# Patient Record
Sex: Male | Born: 1960 | Race: White | Hispanic: No | Marital: Married | Smoking: Never smoker
Health system: Southern US, Community
[De-identification: ages and names within clinical notes are randomized; demographics above are authoritative.]

## PROBLEM LIST (undated history)

## (undated) DIAGNOSIS — I10 Essential (primary) hypertension: Secondary | ICD-10-CM

## (undated) HISTORY — PX: HERNIA REPAIR: SHX51

## (undated) HISTORY — PX: CHOLECYSTECTOMY: SHX55

## (undated) HISTORY — PX: TONSILLECTOMY: SUR1361

---

## 2010-02-03 ENCOUNTER — Emergency Department (HOSPITAL_BASED_OUTPATIENT_CLINIC_OR_DEPARTMENT_OTHER): Admission: EM | Admit: 2010-02-03 | Discharge: 2010-02-03 | Payer: Self-pay | Admitting: Emergency Medicine

## 2010-05-26 LAB — URINALYSIS, ROUTINE W REFLEX MICROSCOPIC
Bilirubin Urine: NEGATIVE
Glucose, UA: NEGATIVE mg/dL
Ketones, ur: 15 mg/dL — AB
Protein, ur: 100 mg/dL — AB
pH: 5.5 (ref 5.0–8.0)

## 2010-05-26 LAB — URINE MICROSCOPIC-ADD ON

## 2014-11-04 ENCOUNTER — Encounter (HOSPITAL_COMMUNITY): Payer: Self-pay | Admitting: *Deleted

## 2014-11-04 ENCOUNTER — Ambulatory Visit (HOSPITAL_COMMUNITY): Payer: 59

## 2014-11-04 ENCOUNTER — Emergency Department (HOSPITAL_COMMUNITY)
Admission: EM | Admit: 2014-11-04 | Discharge: 2014-11-04 | Disposition: A | Discharge status: Home / Self Care | Payer: 59 | Attending: Emergency Medicine | Admitting: Emergency Medicine

## 2014-11-04 DIAGNOSIS — K432 Incisional hernia without obstruction or gangrene: Secondary | ICD-10-CM

## 2014-11-04 DIAGNOSIS — K56600 Partial intestinal obstruction, unspecified as to cause: Secondary | ICD-10-CM

## 2014-11-04 DIAGNOSIS — Z880 Allergy status to penicillin: Secondary | ICD-10-CM | POA: Insufficient documentation

## 2014-11-04 DIAGNOSIS — I10 Essential (primary) hypertension: Secondary | ICD-10-CM | POA: Insufficient documentation

## 2014-11-04 DIAGNOSIS — R1013 Epigastric pain: Secondary | ICD-10-CM | POA: Diagnosis present

## 2014-11-04 HISTORY — DX: Essential (primary) hypertension: I10

## 2014-11-04 LAB — CBC WITH DIFFERENTIAL/PLATELET
BASOS ABS: 0 10*3/uL (ref 0.0–0.1)
BASOS PCT: 0 % (ref 0–1)
EOS ABS: 0.1 10*3/uL (ref 0.0–0.7)
EOS PCT: 1 % (ref 0–5)
HCT: 47.4 % (ref 39.0–52.0)
Hemoglobin: 16.3 g/dL (ref 13.0–17.0)
Lymphocytes Relative: 9 % — ABNORMAL LOW (ref 12–46)
Lymphs Abs: 1.2 10*3/uL (ref 0.7–4.0)
MCH: 31.2 pg (ref 26.0–34.0)
MCHC: 34.4 g/dL (ref 30.0–36.0)
MCV: 90.8 fL (ref 78.0–100.0)
Monocytes Absolute: 1 10*3/uL (ref 0.1–1.0)
Monocytes Relative: 8 % (ref 3–12)
Neutro Abs: 10.7 10*3/uL — ABNORMAL HIGH (ref 1.7–7.7)
Neutrophils Relative %: 82 % — ABNORMAL HIGH (ref 43–77)
PLATELETS: 218 10*3/uL (ref 150–400)
RBC: 5.22 MIL/uL (ref 4.22–5.81)
RDW: 12.6 % (ref 11.5–15.5)
WBC: 13.1 10*3/uL — AB (ref 4.0–10.5)

## 2014-11-04 LAB — COMPREHENSIVE METABOLIC PANEL
ALT: 24 U/L (ref 17–63)
AST: 19 U/L (ref 15–41)
Albumin: 4.3 g/dL (ref 3.5–5.0)
Alkaline Phosphatase: 62 U/L (ref 38–126)
Anion gap: 7 (ref 5–15)
BUN: 11 mg/dL (ref 6–20)
CHLORIDE: 102 mmol/L (ref 101–111)
CO2: 26 mmol/L (ref 22–32)
CREATININE: 0.86 mg/dL (ref 0.61–1.24)
Calcium: 9.4 mg/dL (ref 8.9–10.3)
GFR calc non Af Amer: >60 mL/min (ref >60)
Glucose, Bld: 139 mg/dL — ABNORMAL HIGH (ref 65–99)
Potassium: 3.8 mmol/L (ref 3.5–5.1)
SODIUM: 135 mmol/L (ref 135–145)
Total Bilirubin: 2.7 mg/dL — ABNORMAL HIGH (ref 0.3–1.2)
Total Protein: 7.8 g/dL (ref 6.5–8.1)

## 2014-11-04 LAB — I-STAT CG4 LACTIC ACID, ED: LACTIC ACID, VENOUS: 1.11 mmol/L (ref 0.5–2.0)

## 2014-11-04 LAB — LIPASE, BLOOD: Lipase: 15 U/L — ABNORMAL LOW (ref 22–51)

## 2014-11-04 MED ORDER — LIDOCAINE VISCOUS 2 % MT SOLN
15.0000 mL | Freq: Once | OROMUCOSAL | Status: AC
  Administered 2014-11-04: 15 mL via OROMUCOSAL
  Filled 2014-11-04: qty 15

## 2014-11-04 MED ORDER — MORPHINE SULFATE (PF) 4 MG/ML IV SOLN
8.0000 mg | Freq: Once | INTRAVENOUS | Status: AC
  Administered 2014-11-04: 8 mg via INTRAVENOUS
  Filled 2014-11-04: qty 2

## 2014-11-04 MED ORDER — IOHEXOL 300 MG/ML  SOLN
150.0000 mL | Freq: Once | INTRAMUSCULAR | Status: AC | PRN
  Administered 2014-11-04: 125 mL via INTRAVENOUS

## 2014-11-04 MED ORDER — IOHEXOL 300 MG/ML  SOLN
50.0000 mL | Freq: Once | INTRAMUSCULAR | Status: DC | PRN
  Administered 2014-11-04: 50 mL via ORAL
  Filled 2014-11-04: qty 50

## 2014-11-04 MED ORDER — ALUM & MAG HYDROXIDE-SIMETH 200-200-20 MG/5ML PO SUSP
15.0000 mL | Freq: Once | ORAL | Status: AC
  Administered 2014-11-04: 15 mL via ORAL
  Filled 2014-11-04: qty 30

## 2014-11-04 MED ORDER — ONDANSETRON HCL 4 MG/2ML IJ SOLN
4.0000 mg | Freq: Once | INTRAMUSCULAR | Status: AC
  Administered 2014-11-04: 4 mg via INTRAVENOUS
  Filled 2014-11-04: qty 2

## 2014-11-04 NOTE — ED Notes (Addendum)
Pt reports abd pain since yesterday, pain 9/10, vomited this morning.pt went to pcp, who sent pt for xray, xray shows "surgical clips right upper quadrant. dilated loops of small bowel are noted. Stool is in the right colon appear finding noted suggestive of adynamic ileus, partial small bowel obstruction cannot be excluded. No free air".   Last bowel movement yesterday. Hx of abd hernia repair in 2003  Dr Luiz Iron pcp

## 2014-11-04 NOTE — ED Provider Notes (Signed)
CSN: [id number]     Arrival date & time 11/04/14  1129 History   First MD Initiated Contact with Patient 11/04/14 1211     Chief Complaint  Patient presents with  . Abdominal Pain  . Possible Bowel Obstruction      (Consider location/radiation/quality/duration/timing/severity/associated sxs/prior Treatment) Patient is a 54 y.o. male presenting with abdominal pain. The history is provided by the patient.  Abdominal Pain Pain location:  Epigastric Pain quality: sharp and shooting   Pain radiates to:  Does not radiate Pain severity:  Severe Onset quality:  Gradual Duration:  40 weeks Timing:  Intermittent Progression:  Waxing and waning Chronicity:  New Relieved by:  Nothing Worsened by:  Nothing tried Ineffective treatments:  None tried Associated symptoms: nausea and vomiting (x1)   Associated symptoms: no chest pain, no chills, no diarrhea, no fever and no shortness of breath   Risk factors: no alcohol abuse    54 yo M with a chief complaint of epigastric abdominal pain. This started about 8 or 9 months ago and has slowly come and gone. This episode started yesterday. Sharp shooting epigastric. Denies radiation anywhere. This visit with one episode of vomiting this morning. Went to outpatient visit had a KUB that was concerning for acute bowel obstruction was sent here for evaluation. Patient has had a hernia repair as well as a cholecystectomy. Denies fevers or chills. Has had flatus without difficulty. Last bowel movement was yesterday and was normal for him.  Past Medical History  Diagnosis Date  . Hypertension    Past Surgical History  Procedure Laterality Date  . Hernia repair      abdominal 2003   History reviewed. No pertinent family history. Social History  Substance Use Topics  . Smoking status: Never Smoker   . Smokeless tobacco: None  . Alcohol Use: Yes    Review of Systems  Constitutional: Negative for fever and chills.  HENT: Negative for congestion and  facial swelling.   Eyes: Negative for discharge and visual disturbance.  Respiratory: Negative for shortness of breath.   Cardiovascular: Negative for chest pain and palpitations.  Gastrointestinal: Positive for nausea, vomiting (x1) and abdominal pain. Negative for diarrhea.  Musculoskeletal: Negative for myalgias and arthralgias.  Skin: Negative for color change and rash.  Neurological: Negative for tremors, syncope and headaches.  Psychiatric/Behavioral: Negative for confusion and dysphoric mood.      Allergies  Penicillins  Home Medications   Prior to Admission medications   Medication Sig Start Date End Date Taking? Authorizing Provider  hydrochlorothiazide (HYDRODIURIL) 25 MG tablet Take 25 mg by mouth daily. 10/29/14  Yes Historical Provider, MD  nadolol (CORGARD) 40 MG tablet Take 40 mg by mouth daily. 10/29/14  Yes Historical Provider, MD  telmisartan (MICARDIS) 40 MG tablet Take 40 mg by mouth daily. 10/29/14  Yes Historical Provider, MD   BP 148/77 mmHg  Pulse 61  Temp(Src) 97.7 F (36.5 C) (Oral)  Resp 22  SpO2 100% Physical Exam  Constitutional: He is oriented to person, place, and time. He appears well-developed and well-nourished.  HENT:  Head: Normocephalic and atraumatic.  Eyes: EOM are normal. Pupils are equal, round, and reactive to light.  Neck: Normal range of motion. Neck supple. No JVD present.  Cardiovascular: Normal rate and regular rhythm.  Exam reveals no gallop and no friction rub.   No murmur heard. Pulmonary/Chest: No respiratory distress. He has no wheezes.  Abdominal: He exhibits mass (periumbilical hernia). He exhibits no distension. There is  tenderness (Epigastric). There is no rebound and no guarding.  Musculoskeletal: Normal range of motion.  Neurological: He is alert and oriented to person, place, and time.  Skin: No rash noted. No pallor.  Psychiatric: He has a normal mood and affect. His behavior is normal.    ED Course  Procedures  (including critical care time) Labs Review Labs Reviewed  CBC WITH DIFFERENTIAL/PLATELET - Abnormal; Notable for the following:    WBC 13.1 (*)    Neutrophils Relative % 82 (*)    Neutro Abs 10.7 (*)    Lymphocytes Relative 9 (*)    All other components within normal limits  COMPREHENSIVE METABOLIC PANEL - Abnormal; Notable for the following:    Glucose, Bld 139 (*)    Total Bilirubin 2.7 (*)    All other components within normal limits  LIPASE, BLOOD - Abnormal; Notable for the following:    Lipase 15 (*)    All other components within normal limits  I-STAT CG4 LACTIC ACID, ED  I-STAT CG4 LACTIC ACID, ED    Imaging Review Ct Abdomen Pelvis W Contrast  11/04/2014   CLINICAL DATA:  Pain, vomiting.  EXAM: CT ABDOMEN AND PELVIS WITH CONTRAST  TECHNIQUE: Multidetector CT imaging of the abdomen and pelvis was performed using the standard protocol following bolus administration of intravenous contrast.  CONTRAST:  OMNIPAQUE IOHEXOL 300 MG/ML  SOLN  COMPARISON:  None.  FINDINGS: Lower chest:  Clear lung bases.  Normal heart size.  Hepatobiliary: Diffuse hepatic low attenuation as can be seen with hepatic steatosis. No focal hepatic mass. Prior cholecystectomy.  Pancreas: Normal.  Spleen: Normal.  Adrenals/Urinary Tract: Normal adrenal glands. Normal kidneys. No urolithiasis or obstructive uropathy. Normal bladder.  Stomach/Bowel: There is mild small bowel dilatation with multiple small bowel air-fluid levels. There is a left paramedian supraumbilical hernia containing a loop of small bowel with the distal small bowel decompressed. There is no abdominal or pelvic free fluid. There is no pneumatosis, pneumoperitoneum or portal venous gas.  Vascular/Lymphatic: Normal caliber abdominal aorta with atherosclerosis. No abdominal or pelvic lymphadenopathy.  Other: No fluid collection or hematoma.  Musculoskeletal: No lytic or sclerotic osseous lesion. No acute osseous abnormality.  IMPRESSION: 1. Mild  small bowel dilatation with multiple air-fluid levels most consistent with a small bowel obstruction secondary to a left paramedian supraumbilical hernia. 2. Hepatic steatosis.   Electronically Signed   By: Elige Ko   On: 11/04/2014 14:40   I have personally reviewed and evaluated these images and lab results as part of my medical decision-making.   EKG Interpretation None      MDM   Final diagnoses:  Incisional hernia, without obstruction or gangrene  Partial small bowel obstruction    54 yo M with a chief complaint of epigastric abdominal pain. With psychosis noted at outside facility. No other labs again return. Will obtain a i-STAT lactate CBC CMP and lipase. CT scan of abdomen and pelvis contrast to evaluate for possibility of small bowel obstruction.   Mild leukocytosis total bili elevated at 2.7.   Partial small bowel structure noted on CT scan associated with periumbilical hernia. Patient reassessed and feeling much better. He'll do palpate a defect hernia no longer palpable. With patient feeling much better seen no need for surgical consult. The patient follow-up with his surgeon for repair.  3:24 PM:  I have discussed the diagnosis/risks/treatment options with the patient and family and believe the pt to be eligible for discharge home to follow-up with  General sugery. We also discussed returning to the ED immediately if new or worsening sx occur. We discussed the sx which are most concerning (e.g., sudden worsening pain, no flatus) that necessitate immediate return. Medications administered to the patient during their visit and any new prescriptions provided to the patient are listed below.  Medications given during this visit Medications  iohexol (OMNIPAQUE) 300 MG/ML solution 50 mL (50 mLs Oral Contrast Given 11/04/14 1252)  morphine 4 MG/ML injection 8 mg (8 mg Intravenous Given 11/04/14 1334)  ondansetron (ZOFRAN) injection 4 mg (4 mg Intravenous Given 11/04/14 1334)  alum  & mag hydroxide-simeth (MAALOX/MYLANTA) 200-200-20 MG/5ML suspension 15 mL (15 mLs Oral Given 11/04/14 1334)  lidocaine (XYLOCAINE) 2 % viscous mouth solution 15 mL (15 mLs Mouth/Throat Given 11/04/14 1334)  iohexol (OMNIPAQUE) 300 MG/ML solution 150 mL (125 mLs Intravenous Contrast Given 11/04/14 1417)    New Prescriptions   No medications on file     The patient appears reasonably screen and/or stabilized for discharge and I doubt any other medical condition or other [facility] requiring further screening, evaluation, or treatment in the ED at this time prior to discharge.    Melene Plan, DO 11/04/14 1524

## 2014-11-04 NOTE — Discharge Instructions (Signed)
Take 4 over the counter ibuprofen tablets 3 times a day or 2 over-the-counter naproxen tablets twice a day for pain.  Hernia A hernia occurs when an internal organ pushes out through a weak spot in the abdominal wall. Hernias most commonly occur in the groin and around the navel. Hernias often can be pushed back into place (reduced). Most hernias tend to get worse over time. Some abdominal hernias can get stuck in the opening (irreducible or incarcerated hernia) and cannot be reduced. An irreducible abdominal hernia which is tightly squeezed into the opening is at risk for impaired blood supply (strangulated hernia). A strangulated hernia is a medical emergency. Because of the risk for an irreducible or strangulated hernia, surgery may be recommended to repair a hernia. CAUSES   Heavy lifting.  Prolonged coughing.  Straining to have a bowel movement.  A cut (incision) made during an abdominal surgery. HOME CARE INSTRUCTIONS   Bed rest is not required. You may continue your normal activities.  Avoid lifting more than 10 pounds (4.5 kg) or straining.  Cough gently. If you are a smoker it is best to stop. Even the best hernia repair can break down with the continual strain of coughing. Even if you do not have your hernia repaired, a cough will continue to aggravate the problem.  Do not wear anything tight over your hernia. Do not try to keep it in with an outside bandage or truss. These can damage abdominal contents if they are trapped within the hernia sac.  Eat a normal diet.  Avoid constipation. Straining over long periods of time will increase hernia size and encourage breakdown of repairs. If you cannot do this with diet alone, stool softeners may be used. SEEK IMMEDIATE MEDICAL CARE IF:   You have a fever.  You develop increasing abdominal pain.  You feel nauseous or vomit.  Your hernia is stuck outside the abdomen, looks discolored, feels hard, or is tender.  You have any  changes in your bowel habits or in the hernia that are unusual for you.  You have increased pain or swelling around the hernia.  You cannot push the hernia back in place by applying gentle pressure while lying down. MAKE SURE YOU:   Understand these instructions.  Will watch your condition.  Will get help right away if you are not doing well or get worse. Document Released: 03/01/2005 Document Revised: 05/24/2011 Document Reviewed: 10/19/2007 [facility] Patient Information 2015 [place], [place]. This information is not intended to replace advice given to you by your health care provider. Make sure you discuss any questions you have with your health care provider.

## 2015-03-16 HISTORY — PX: HERNIA REPAIR: SHX51

## 2018-04-11 ENCOUNTER — Other Ambulatory Visit: Payer: Self-pay | Admitting: General Surgery

## 2018-04-11 ENCOUNTER — Other Ambulatory Visit (HOSPITAL_COMMUNITY): Payer: Self-pay | Admitting: General Surgery

## 2018-04-18 ENCOUNTER — Ambulatory Visit (HOSPITAL_COMMUNITY)
Admission: RE | Admit: 2018-04-18 | Discharge: 2018-04-18 | Disposition: A | Discharge status: Home / Self Care | Payer: 59 | Source: Ambulatory Visit | Attending: General Surgery | Admitting: General Surgery

## 2018-04-18 ENCOUNTER — Other Ambulatory Visit: Payer: Self-pay

## 2018-04-18 ENCOUNTER — Other Ambulatory Visit (HOSPITAL_COMMUNITY): Payer: Self-pay | Admitting: General Surgery

## 2018-04-26 ENCOUNTER — Encounter: Payer: 59 | Attending: General Surgery | Admitting: Skilled Nursing Facility1

## 2018-04-26 ENCOUNTER — Encounter: Payer: Self-pay | Admitting: Skilled Nursing Facility1

## 2018-04-26 DIAGNOSIS — E669 Obesity, unspecified: Secondary | ICD-10-CM | POA: Diagnosis present

## 2018-04-26 NOTE — Progress Notes (Signed)
Pre-Op Assessment Visit:  Pre-Operative Sleeve Gastrectomy Surgery  Medical Nutrition Therapy:  Appt start time: 1:58 End time:  3:00  Patient was seen on 04/26/2018 for Pre-Operative Nutrition Assessment. Assessment and letter of approval faxed to [facility] Bariatric Surgery Program coordinator on 04/26/2018.   According to pts referral pt needs to be below 52 BMI before continuing on to have surgery so the pt needs to lose about 41 pounds.  To discuss next time: healthy alternatives for flavoring foods  Pt expectation of surgery: to lose weight  Pt expectation of Dietitian: none  Start weight at [facility]: 406.5 BMI: 57.50   24 hr Dietary Recall: First Meal: skipped Snack: candy Second Meal: fast food Snack: candy Third Meal: eating out or chicken and dumplings  Snack: dessert Beverages: water  Encouraged to engage in 150 minutes of moderate physical activity including cardiovascular and weight baring weekly  Handouts given during visit include:  . Pre-Op Goals . Bariatric Surgery Protein Shakes During the appointment today the following Pre-Op Goals were reviewed with the patient: . Track your food . Make healthy food choices: limit yourself to eating out no more than 4 times a week . Begin to limit portion sizes . Limited concentrated sugars and fried foods . Keep fat/sugar in the single digits per serving on             food labels . Practice CHEWING your food  (aim for 30 chews per bite or until applesauce consistency) . Practice not drinking 15 minutes before, during, and 30 minutes after each meal/snack . Avoid all carbonated beverages  . Avoid/limit caffeinated beverages  . Avoid all sugar-sweetened beverages . Consume 3 meals per day; eat every 3-5 hours . Make a list of non-food related activities . Aim for 64-100 ounces of FLUID daily  . Aim for at least 60-80 grams of PROTEIN daily . Look for a liquid protein source that contain ?15 g protein and  ?5 g carbohydrate  (ex: shakes, drinks, shots): get them for breakfast   -Follow diet recommendations listed below   Energy and Macronutrient Recomendations: Calories: 1600 Carbohydrate: 180 Protein: 120 Fat: 44  Demonstrated degree of understanding via:  Teach Back  Teaching Method Utilized:  Visual Auditory Hands on  Barriers to learning/adherence to lifestyle change: none identified   Patient to call the [facility] to enroll in Pre-Op and Post-Op Nutrition Education when surgery date is scheduled.

## 2018-05-10 ENCOUNTER — Encounter: Payer: 59 | Admitting: Skilled Nursing Facility1

## 2018-05-10 DIAGNOSIS — E669 Obesity, unspecified: Secondary | ICD-10-CM

## 2018-05-10 NOTE — Progress Notes (Signed)
Sleeve  Assessment: 1st [facility] Appointment.   According to pts referral pt needs to be below 52 BMI before continuing on to have surgery so the pt needs to lose about 41 pounds.   Pt arrives having lost about 15.6 pounds.  Pt states he struggles with eating breakfast and states he hated vanilla premier protein shake. Pt states he has not eaten anything fried until last night. Pt states he has not had any fast food since last visit. Pt states he went to movies and did not get popcorn or soda. Pt states just from moving more at the shop he physically feels better.    Start weight at [facility]: 406.5 Wt: 390.9 BMI: 55.30  MEDICATIONS: see list   DIETARY INTAKE:  24-hr recall:  B ( AM): skipped Snk ( AM):  L ( PM): salad: ham cheese eggs lettuce tomato ranch Snk ( PM): D ( PM): chicken pepper onion hush puppies and cole slaw or ribs with potato and cole slaw  Snk ( PM):  Beverages: water, 2 glasses of orange juice since last month   Usual physical activity: walking dog 20 minutes   Diet to Follow: 1800 calories 200 g carbohydrates 135 g protein 50 g fat   Nutritional Diagnosis:  [place]-3.3 Overweight/obesity related to past poor dietary habits and physical inactivity as evidenced by patient w/ planned sleeve gastrectomy surgery following dietary guidelines for continued weight loss.    Intervention:  Nutrition counseling for upcoming Bariatric Surgery. Goals: -Encouraged to engage in 150 minutes of moderate physical activity including cardiovascular and weight baring weekly -For your ACP tonight: beans instead of rice, ask for cheese on the side and only use half giving the rest to someone at the table -Try some more protein shakes -Get up once an hour and walk the shop  Teaching Method Utilized:  Visual Auditory Hands on  Barriers to learning/adherence to lifestyle change: none identified   Demonstrated degree of understanding via:  Teach Back   Monitoring/Evaluation:  Dietary  intake, exercise, and body weight 3 weeks

## 2018-06-08 ENCOUNTER — Ambulatory Visit: Payer: 59 | Admitting: Skilled Nursing Facility1

## 2018-06-21 ENCOUNTER — Other Ambulatory Visit: Payer: Self-pay

## 2018-06-21 ENCOUNTER — Encounter: Payer: 59 | Attending: General Surgery | Admitting: Skilled Nursing Facility1

## 2018-06-21 DIAGNOSIS — E669 Obesity, unspecified: Secondary | ICD-10-CM | POA: Diagnosis present

## 2018-06-21 NOTE — Progress Notes (Signed)
Sleeve  Assessment: 2nd SWL Appointment.   According to pts referral pt needs to be below 52 BMI before continuing on to have surgery so the pt needs to lose about 41 pounds.  Pt arrives having lost about 13.2 pounds. Pt states he avoids weighing now that he sees how stressful it is to weigh at home. Pt states he has been more active with walking around the shop. Pt states he was trying to drink protein shakes for breakfast but he finds them to have a bad aftertaste so he has not been eating breakfast. Pt states he has cut out eating fried foods stating it has not been too bad.   Start weight at [facility]: 406.5 Wt: 377.7 BMI: 53.43  MEDICATIONS: see list   DIETARY INTAKE:  24-hr recall:  B ( AM): skipped Snk ( AM):  L ( PM): salad: ham cheese eggs lettuce tomato ranch or [place] sandwich Snk ( PM): D ( PM): chicken pepper onion hush puppies and cole slaw or ribs with potato and cole slaw steak or chicken with salad and baked potato or chicken stir fry  Snk ( PM):  Beverages: water, gatorade   Usual physical activity: walking dog 20 minutes   Diet to Follow: 1800 calories 200 g carbohydrates 135 g protein 50 g fat   Nutritional Diagnosis:  [place]-3.3 Overweight/obesity related to past poor dietary habits and physical inactivity as evidenced by patient w/ planned sleeve gastrectomy surgery following dietary guidelines for continued weight loss.    Intervention:  Nutrition counseling for upcoming Bariatric Surgery. Goals: -Encouraged to engage in 150 minutes of moderate physical activity including cardiovascular and weight baring weekly -For your ACP tonight: beans instead of rice, ask for cheese on the side and only use half giving the rest to someone at the table -Try some more protein shakes -Continue to walk around the shop -Continue to not eat fried foods  -Grape a piece of fruit in the morning before going to work or a Optometrist Method Utilized:   Scientific laboratory technician Hands on  Barriers to learning/adherence to lifestyle change: none identified   Demonstrated degree of understanding via:  Teach Back   Monitoring/Evaluation:  Dietary intake, exercise, and body weight 3 weeks

## 2018-07-20 ENCOUNTER — Ambulatory Visit: Payer: 59 | Admitting: Skilled Nursing Facility1

## 2018-07-26 ENCOUNTER — Encounter: Payer: 59 | Attending: General Surgery | Admitting: Skilled Nursing Facility1

## 2018-07-26 ENCOUNTER — Other Ambulatory Visit: Payer: Self-pay

## 2018-07-26 DIAGNOSIS — E669 Obesity, unspecified: Secondary | ICD-10-CM | POA: Diagnosis present

## 2018-07-26 NOTE — Progress Notes (Signed)
Sleeve  Assessment: 3rd SWL Appointment.   According to pts referral pt needs to be below 52 BMI before continuing on to have surgery so the pt needs to lose about 41 pounds.  Pt arrives having maintained his weight. Pt states he figured he was not going to have lost weight due to celebrating his wife's birthday. Pt states he has started eating fruit for breakfast. Pt states not eating fried food as been easier than her thought. Pt states he has more energy than when he first started coming. Pt states he has been doing more work around the house too. Pt states he loves the glass coke but has not had once for 3 months.   Start weight at [facility]: 406.5 Wt: 377.7 BMI: 53.43  MEDICATIONS: see list   DIETARY INTAKE:  24-hr recall: having more dessert throughout the week  B ( AM): fruit  Snk ( AM):  L ( PM): salad: ham cheese eggs lettuce tomato ranch or [place] sandwich Snk ( PM): D ( PM): chicken pepper onion hush puppies and cole slaw or ribs with potato and cole slaw steak or chicken with salad and baked potato or chicken stir fry  Snk ( PM):  Beverages: water, gatorade   Usual physical activity: walking dog 20 minutes 7 days a week   Diet to Follow: 1800 calories 200 g carbohydrates 135 g protein 50 g fat   Nutritional Diagnosis:  [place]-3.3 Overweight/obesity related to past poor dietary habits and physical inactivity as evidenced by patient w/ planned sleeve gastrectomy surgery following dietary guidelines for continued weight loss.    Intervention:  Nutrition counseling for upcoming Bariatric Surgery. Goals: -Encouraged to engage in 150 minutes of moderate physical activity including cardiovascular and weight baring weekly -Scale back dessert to once a week or once every other week   Teaching Method Utilized:  Visual Auditory Hands on  Barriers to learning/adherence to lifestyle change: none identified   Demonstrated degree of understanding via:  Teach Back    Monitoring/Evaluation:  Dietary intake, exercise, and body weight 3 weeks

## 2018-08-29 ENCOUNTER — Encounter: Payer: 59 | Attending: General Surgery | Admitting: Skilled Nursing Facility1

## 2018-08-29 ENCOUNTER — Other Ambulatory Visit: Payer: Self-pay

## 2018-08-29 ENCOUNTER — Ambulatory Visit: Payer: 59 | Admitting: Skilled Nursing Facility1

## 2018-08-29 DIAGNOSIS — E669 Obesity, unspecified: Secondary | ICD-10-CM | POA: Insufficient documentation

## 2018-08-29 NOTE — Progress Notes (Signed)
Sleeve  Assessment: 4th SWL Appointment.   According to pts referral pt needs to be below 52 BMI before continuing on to have surgery so the pt needs to lose about 41 pounds.  Pt states he loves the glass coke but has not had once for 3 months.   Pt arrives having lost about 7 pounds for a total of 37.5 pounds.  Pt states he can stick with being more active and not eating as much "Fun" foods keeping them to once a week or once every other week. Pt states he has severely limited his fried foods when before he would eat it 5 or mor times a week and no carbonated drinks.   Pt states he can see where he can do more physically since making all these changes.   Start weight at [facility]: 406.5 Wt: 369 BMI: 52.2  MEDICATIONS: see list   DIETARY INTAKE:  24-hr recall: having more dessert throughout the week  B ( AM): fruit  Snk ( AM):  L ( PM): salad: ham cheese eggs lettuce tomato ranch or [place] sandwich Snk ( PM): D ( PM): chicken pepper onion hush puppies and cole slaw or ribs with potato and cole slaw steak or chicken with salad and baked potato or chicken stir fry with veggies Snk ( PM):  Beverages: water, gatorade   Usual physical activity: walking dog 20 minutes 7 days a week; house/yard work  Diet to Follow: 1800 calories 200 g carbohydrates 135 g protein 50 g fat   Nutritional Diagnosis:  [place]-3.3 Overweight/obesity related to past poor dietary habits and physical inactivity as evidenced by patient w/ planned sleeve gastrectomy surgery following dietary guidelines for continued weight loss.    Intervention:  Nutrition counseling for upcoming Bariatric Surgery. Goals: -Encouraged to engage in 150 minutes of moderate physical activity including cardiovascular and weight baring weekly -Great job reaching your goals!  Teaching Method Utilized:  Visual Auditory Hands on  Barriers to learning/adherence to lifestyle change: none identified   Demonstrated degree of  understanding via:  Teach Back   Monitoring/Evaluation:  Dietary intake, exercise, and body weight

## 2018-11-06 ENCOUNTER — Encounter: Payer: Self-pay | Admitting: Dietician

## 2018-11-06 ENCOUNTER — Other Ambulatory Visit: Payer: Self-pay

## 2018-11-06 ENCOUNTER — Encounter: Payer: 59 | Attending: Surgery | Admitting: Dietician

## 2018-11-06 DIAGNOSIS — E669 Obesity, unspecified: Secondary | ICD-10-CM | POA: Diagnosis present

## 2018-11-06 NOTE — Patient Instructions (Signed)
Patient will follow-up at [facility] 2 weeks post operatively for diet advancement per MD. 

## 2018-11-06 NOTE — Progress Notes (Signed)
Pre-Operative Nutrition Class   Appt Start Time: 8:15am    End Time: 10:15am   Patient was seen on 11/06/2018 for Pre-Operative Bariatric Surgery Education at [facility].   Surgery date: (Unknown) Surgery type: Sleeve Gastrectomy  Start weight at [facility]: 406.5 lbs (date: 04/26/2018) Weight today: 365 lbs BMI: 51.6 kg/m2   Samples given per MNT protocol. Patient educated on appropriate usage: Bariatric Advantage Multivitamin Lot #[id number] Exp: 06/04/2019   Bariatric Fusion Calcium    Protein2O Protein Drink Exp: 11/15/2019   The following the learning objectives were met by the patient during this course:  Identify Pre-Op Dietary Goals and will begin 2 weeks pre-operatively  Identify appropriate sources of fluids and proteins   State protein recommendations and appropriate sources pre and post-operatively  Identify Post-Operative Dietary Goals and will follow for 2 weeks post-operatively  Identify appropriate multivitamin and calcium sources  Describe the need for physical activity post-operatively and will follow MD recommendations  State when to call healthcare provider regarding medication questions or post-operative complications   Handouts given during class include:  Pre-Op Bariatric Surgery Diet Handout  Protein Shake Handout  Post-Op Bariatric Surgery Nutrition Handout  [facility] Program Information Flyer  Support Group Information Flyer  [facility] Bariatric Supplements Price List   Follow-Up Plan: Patient will follow-up at [facility] 2 weeks post operatively for diet advancement per MD.

## 2018-12-08 NOTE — Patient Instructions (Addendum)
DUE TO COVID-19 ONLY ONE VISITOR IS ALLOWED TO COME WITH YOU AND STAY IN THE WAITING ROOM ONLY DURING PRE OP AND PROCEDURE DAY OF SURGERY. THE 1 VISITOR MAY VISIT WITH YOU AFTER SURGERY IN YOUR PRIVATE ROOM DURING VISITING HOURS ONLY!   YOU HAVE COMPLETED YOUR COVID TEST. PLEASE BEGIN THE QUARANTINE INSTRUCTIONS AS OUTLINED IN YOUR HANDOUT.                 Seth Rogers    Your procedure is scheduled on: 12-12-2018   Report to [facility] Main  Entrance    Report to admitting at 12:00PM     Call this number if you have problems the morning of surgery [place], NO CHEWING GUM [place].     Remember: NO SOLID FOOD AFTER 6:00 PM THE NIGHT BEFORE YOUR SURGERY. YOU MAY DRINK CLEAR FLUIDS. MORNING OF SURGERY DRINK:   DRINK 1 G2 drink BEFORE YOU LEAVE HOME, DRINK ALL OF THE  G2 DRINK AT ONE TIME.  THE G2 DRINK YOU DRINK BEFORE YOU LEAVE HOME WILL BE THE LAST FLUIDS YOU DRINK BEFORE SURGERY.     CLEAR LIQUID DIET   Foods Allowed                                                                     Foods Excluded  Coffee and tea, regular and decaf                             liquids that you cannot  Plain Jell-O any favor except red or purple                                           see through such as: Fruit ices (not with fruit pulp)                                     milk, soups, orange juice  Iced Popsicles                                    All solid food                                 Cranberry, grape and apple juices Sports drinks like Gatorade Lightly seasoned clear broth or consume(fat free) Sugar, honey syrup  Sample Menu Breakfast                                Lunch                                     Supper Cranberry juice  Beef broth                            Chicken broth Jell-O                                     Grape juice                           Apple  juice Coffee or tea                        Jell-O                                      Popsicle                                                Coffee or tea                        Coffee or tea  _____________________________________________________________________     Take these medicines the morning of surgery with A SIP OF WATER: NADOLOL                                 You may not have any metal on your body including hair pins and              piercings  Do not wear jewelry, make-up, lotions, powders or perfumes, deodorant                    Men may shave face and neck.   Do not bring valuables to the hospital. Seth Rogers IS NOT             RESPONSIBLE   FOR VALUABLES.  Contacts, dentures or bridgework may not be worn into surgery.  YOU MAY BRING A SMALL OVERNIGHT BAG              Please read over the following fact sheets you were given: _____________________________________________________________________           [facility] - Preparing for Surgery Before surgery, you can play an important role.  Because skin is not sterile, your skin needs to be as free of germs as possible.  You can reduce the number of germs on your skin by washing with CHG (chlorahexidine gluconate) soap before surgery.  CHG is an antiseptic cleaner which kills germs and bonds with the skin to continue killing germs even after washing. Please DO NOT use if you have an allergy to CHG or antibacterial soaps.  If your skin becomes reddened/irritated stop using the CHG and inform your nurse when you arrive at Short Stay. Do not shave (including legs and underarms) for at least 48 hours prior to the first CHG shower.  You may shave your face/neck. Please follow these instructions carefully:  1.  Shower with CHG Soap the night before surgery and the  morning of Surgery.  2.  If you choose to wash your hair, wash  your hair first as usual with your  normal  shampoo.  3.  After you shampoo, rinse your hair and  body thoroughly to remove the  shampoo.                           4.  Use CHG as you would any other liquid soap.  You can apply chg directly  to the skin and wash                       Gently with a scrungie or clean washcloth.  5.  Apply the CHG Soap to your body ONLY FROM THE NECK DOWN.   Do not use on face/ open                           Wound or open sores. Avoid contact with eyes, ears mouth and genitals (private parts).                       Wash face,  Genitals (private parts) with your normal soap.             6.  Wash thoroughly, paying special attention to the area where your surgery  will be performed.  7.  Thoroughly rinse your body with warm water from the neck down.  8.  DO NOT shower/wash with your normal soap after using and rinsing off  the CHG Soap.                9.  Pat yourself dry with a clean towel.            10.  Wear clean pajamas.            11.  Place clean sheets on your bed the night of your first shower and do not  sleep with pets. Day of Surgery : Do not apply any lotions/deodorants the morning of surgery.  Please wear clean clothes to the hospital/surgery center.  FAILURE TO FOLLOW THESE INSTRUCTIONS MAY RESULT IN THE CANCELLATION OF YOUR SURGERY PATIENT SIGNATURE_________________________________  NURSE SIGNATURE__________________________________  ________________________________________________________________________    PAIN IS EXPECTED AFTER SURGERY AND WILL NOT BE COMPLETELY ELIMINATED. AMBULATION AND TYLENOL WILL HELP REDUCE INCISIONAL AND GAS PAIN. MOVEMENT IS KEY!  YOU ARE EXPECTED TO BE OUT OF BED WITHIN 4 HOURS OF ADMISSION TO YOUR PATIENT ROOM.  SITTING IN THE RECLINER THROUGHOUT THE DAY IS IMPORTANT FOR DRINKING FLUIDS AND MOVING GAS THROUGHOUT THE GI TRACT.  COMPRESSION STOCKINGS SHOULD BE WORN [facility] STAY UNLESS YOU ARE WALKING.   INCENTIVE SPIROMETER SHOULD BE USED EVERY HOUR WHILE AWAKE TO DECREASE POST-OPERATIVE  COMPLICATIONS SUCH AS PNEUMONIA.  WHEN DISCHARGED HOME, IT IS IMPORTANT TO CONTINUE TO WALK EVERY HOUR AND USE THE INCENTIVE SPIROMETER EVERY HOUR.      Incentive Spirometer  An incentive spirometer is a tool that can help keep your lungs clear and active. This tool measures how well you are filling your lungs with each breath. Taking long deep breaths may help reverse or decrease the chance of developing breathing (pulmonary) problems (especially infection) following:  A long period of time when you are unable to move or be active. BEFORE THE PROCEDURE   If the spirometer includes an indicator to show your best effort, your nurse or respiratory therapist will set it to a desired goal.  If possible, sit up  straight or lean slightly forward. Try not to slouch.  Hold the incentive spirometer in an upright position. INSTRUCTIONS FOR USE  1. Sit on the edge of your bed if possible, or sit up as far as you can in bed or on a chair. 2. Hold the incentive spirometer in an upright position. 3. Breathe out normally. 4. Place the mouthpiece in your mouth and seal your lips tightly around it. 5. Breathe in slowly and as deeply as possible, raising the piston or the ball toward the top of the column. 6. Hold your breath for 3-5 seconds or for as long as possible. Allow the piston or ball to fall to the bottom of the column. 7. Remove the mouthpiece from your mouth and breathe out normally. 8. Rest for a few seconds and repeat Steps 1 through 7 at least 10 times every 1-2 hours when you are awake. Take your time and take a few normal breaths between deep breaths. 9. The spirometer may include an indicator to show your best effort. Use the indicator as a goal to work toward during each repetition. 10. After each set of 10 deep breaths, practice coughing to be sure your lungs are clear. If you have an incision (the cut made at the time of surgery), support your incision when coughing by placing a  pillow or rolled up towels firmly against it. Once you are able to get out of bed, walk around indoors and cough well. You may stop using the incentive spirometer when instructed by your caregiver.  RISKS AND COMPLICATIONS  Take your time so you do not get dizzy or light-headed.  If you are in pain, you may need to take or ask for pain medication before doing incentive spirometry. It is harder to take a deep breath if you are having pain. AFTER USE  Rest and breathe slowly and easily.  It can be helpful to keep track of a log of your progress. Your caregiver can provide you with a simple table to help with this. If you are using the spirometer at home, follow these instructions: SEEK MEDICAL CARE IF:   You are having difficultly using the spirometer.  You have trouble using the spirometer as often as instructed.  Your pain medication is not giving enough relief while using the spirometer.  You develop fever of 100.5 F (38.1 C) or higher. SEEK IMMEDIATE MEDICAL CARE IF:   You cough up bloody sputum that had not been present before.  You develop fever of 102 F (38.9 C) or greater.  You develop worsening pain at or near the incision site. MAKE SURE YOU:   Understand these instructions.  Will watch your condition.  Will get help right away if you are not doing well or get worse. Document Released: 07/12/2006 Document Revised: 05/24/2011 Document Reviewed: 09/12/2006 [facility] Patient Information 2014 [place], [place].   ________________________________________________________________________

## 2018-12-08 NOTE — Progress Notes (Signed)
Need orders stat. Pat appt 9-28. ty

## 2018-12-09 ENCOUNTER — Other Ambulatory Visit (HOSPITAL_COMMUNITY)
Admission: RE | Admit: 2018-12-09 | Discharge: 2018-12-09 | Disposition: A | Discharge status: Home / Self Care | Payer: 59 | Source: Ambulatory Visit | Attending: Surgery | Admitting: Surgery

## 2018-12-09 DIAGNOSIS — Z01812 Encounter for preprocedural laboratory examination: Secondary | ICD-10-CM | POA: Insufficient documentation

## 2018-12-09 DIAGNOSIS — Z20828 Contact with and (suspected) exposure to other viral communicable diseases: Secondary | ICD-10-CM | POA: Insufficient documentation

## 2018-12-10 DIAGNOSIS — I10 Essential (primary) hypertension: Secondary | ICD-10-CM | POA: Diagnosis present

## 2018-12-10 LAB — NOVEL CORONAVIRUS, NAA (HOSP ORDER, SEND-OUT TO REF LAB; TAT 18-24 HRS): SARS-CoV-2, NAA: NOT DETECTED

## 2018-12-10 NOTE — H&P (Signed)
Chief Complaint:  Morbid obesity BMI 52  History of Present Illness:  Seth Rogers is an 58 y.o. male who was evaluated initially by Dr. Excell Seltzer for his morbid obesity.  He lost 40 lbs preop and is excited to proceed with lap sleeve gastrectomy.  He was seen by me and is ready for this procedure.    Past Medical History:  Diagnosis Date  . Hypertension     Past Surgical History:  Procedure Laterality Date  . HERNIA REPAIR     abdominal 2003    No current facility-administered medications for this encounter.    Current Outpatient Medications  Medication Sig Dispense Refill  . acetaminophen (TYLENOL) 500 MG tablet Take 1,000 mg by mouth every 6 (six) hours as needed (for pain.).    Marland Kitchen hydrochlorothiazide (HYDRODIURIL) 25 MG tablet Take 25 mg by mouth daily.  3  . nadolol (CORGARD) 40 MG tablet Take 40 mg by mouth daily.  3  . telmisartan (MICARDIS) 80 MG tablet Take 80 mg by mouth daily.     Penicillins No family history on file. Social History:   reports that he has never smoked. He does not have any smokeless tobacco history on file. He reports current alcohol use. He reports that he does not use drugs.   REVIEW OF SYSTEMS : Negative except for see problem list  Physical Exam:   There were no vitals taken for this visit. There is no height or weight on file to calculate BMI.  Gen:  WDWN WM NAD  Neurological: Alert and oriented to person, place, and time. Motor and sensory function is grossly intact  Head: Normocephalic and atraumatic.  Eyes: Conjunctivae are normal. Pupils are equal, round, and reactive to light. No scleral icterus.  Neck: Normal range of motion. Neck supple. No tracheal deviation or thyromegaly present.  Cardiovascular:  SR without murmurs or gallops.  No carotid bruits Breast:  Not examined Respiratory: Effort normal.  No respiratory distress. No chest wall tenderness. Breath sounds normal.  No wheezes, rales or rhonchi.  Abdomen:  centripedal  obesity with prior lap chole and midline scar from hernia repair GU:  wnl Musculoskeletal: Normal range of motion. Extremities are nontender. No cyanosis, edema or clubbing noted Lymphadenopathy: No cervical, preauricular, postauricular or axillary adenopathy is present Skin: Skin is warm and dry. No rash noted. No diaphoresis. No erythema. No pallor. Pscyh: Normal mood and affect. Behavior is normal. Judgment and thought content normal.   LABORATORY RESULTS: No results found for this or any previous visit (from the past 48 hour(s)).   RADIOLOGY RESULTS: No results found.  Problem List: There are no active problems to display for this patient.   Assessment & Plan: Morbid obesity for sleeve gastrectomy    Matt B. Hassell Done, MD, [facility] [phone] beeper [phone]  12/10/2018 8:54 AM

## 2018-12-11 ENCOUNTER — Encounter (HOSPITAL_COMMUNITY): Payer: Self-pay

## 2018-12-11 ENCOUNTER — Encounter (HOSPITAL_COMMUNITY)
Admission: RE | Admit: 2018-12-11 | Discharge: 2018-12-11 | Disposition: A | Discharge status: Home / Self Care | Payer: 59 | Source: Ambulatory Visit | Attending: Surgery | Admitting: Surgery

## 2018-12-11 ENCOUNTER — Other Ambulatory Visit: Payer: Self-pay

## 2018-12-11 LAB — CBC
HCT: 44.8 % (ref 39.0–52.0)
Hemoglobin: 14.7 g/dL (ref 13.0–17.0)
MCH: 31.2 pg (ref 26.0–34.0)
MCHC: 32.8 g/dL (ref 30.0–36.0)
MCV: 95.1 fL (ref 80.0–100.0)
Platelets: 203 10*3/uL (ref 150–400)
RBC: 4.71 MIL/uL (ref 4.22–5.81)
RDW: 12.9 % (ref 11.5–15.5)
WBC: 7.6 10*3/uL (ref 4.0–10.5)
nRBC: 0 % (ref 0.0–0.2)

## 2018-12-11 LAB — COMPREHENSIVE METABOLIC PANEL
ALT: 19 U/L (ref 0–44)
AST: 20 U/L (ref 15–41)
Albumin: 4.7 g/dL (ref 3.5–5.0)
Alkaline Phosphatase: 46 U/L (ref 38–126)
Anion gap: 15 (ref 5–15)
BUN: 17 mg/dL (ref 6–20)
CO2: 22 mmol/L (ref 22–32)
Calcium: 9.4 mg/dL (ref 8.9–10.3)
Chloride: 100 mmol/L (ref 98–111)
Creatinine, Ser: 1.02 mg/dL (ref 0.61–1.24)
GFR calc Af Amer: >60 mL/min (ref >60)
GFR calc non Af Amer: >60 mL/min (ref >60)
Glucose, Bld: 102 mg/dL — ABNORMAL HIGH (ref 70–99)
Potassium: 3.9 mmol/L (ref 3.5–5.1)
Sodium: 137 mmol/L (ref 135–145)
Total Bilirubin: 2.1 mg/dL — ABNORMAL HIGH (ref 0.3–1.2)
Total Protein: 7.7 g/dL (ref 6.5–8.1)

## 2018-12-11 LAB — BASIC METABOLIC PANEL
Anion gap: 14 (ref 5–15)
BUN: 16 mg/dL (ref 6–20)
CO2: 22 mmol/L (ref 22–32)
Calcium: 9.4 mg/dL (ref 8.9–10.3)
Chloride: 101 mmol/L (ref 98–111)
Creatinine, Ser: 0.99 mg/dL (ref 0.61–1.24)
GFR calc Af Amer: >60 mL/min (ref >60)
GFR calc non Af Amer: >60 mL/min (ref >60)
Glucose, Bld: 106 mg/dL — ABNORMAL HIGH (ref 70–99)
Potassium: 3.9 mmol/L (ref 3.5–5.1)
Sodium: 137 mmol/L (ref 135–145)

## 2018-12-11 LAB — DIFFERENTIAL
Abs Immature Granulocytes: 0.01 10*3/uL (ref 0.00–0.07)
Basophils Absolute: 0.1 10*3/uL (ref 0.0–0.1)
Basophils Relative: 1 %
Eosinophils Absolute: 0.2 10*3/uL (ref 0.0–0.5)
Eosinophils Relative: 3 %
Immature Granulocytes: 0 %
Lymphocytes Relative: 25 %
Lymphs Abs: 1.8 10*3/uL (ref 0.7–4.0)
Monocytes Absolute: 0.7 10*3/uL (ref 0.1–1.0)
Monocytes Relative: 9 %
Neutro Abs: 4.5 10*3/uL (ref 1.7–7.7)
Neutrophils Relative %: 62 %

## 2018-12-11 NOTE — Progress Notes (Signed)
PCP - Kristopher Glee., MD Cardiologist -   Chest x-ray - epic 2020 EKG - epic 2020 Stress Test -  ECHO -  Cardiac Cath -   Sleep Study -  CPAP -    Fasting Blood Sugar -  Checks Blood Sugar _____ times a day  Blood Thinner Instructions: Aspirin Instructions: Last Dose:  Anesthesia review:   low heart rate of 41 today at PAT appt. Patient reports no significant hx of cardiac disease barring hypertension. BP elevated , however patient attributes this to not taking his BP medication this morning. Patient denies chest pain , SOB , blurry vision, palpations, nor hx of pre-syncope or syncope. He states that he wasn't even aware he has a slow heart beat. EKG done in February demonstrates HR of 48. APP made aware.  Cbc and bmp per anesthesia collected this morning at pre-op appt as no orders were in epic at time of pre-op . Orders placed in epic after patient discharged from pre-op dept, however main lab able to pull surgeons additional labs off labs collected at pre-op appt. Despite no orders in epic, patient given eras drink and instructions per surgeons request delivered via nursing director , Nathen May.   Patient denies shortness of breath, fever, cough and chest pain at PAT appointment   Patient verbalized understanding of instructions that were given to them at the PAT appointment. Patient was also instructed that they will need to review over the PAT instructions again at home before surgery.

## 2018-12-11 NOTE — Progress Notes (Signed)
   12/11/18 0916  OBSTRUCTIVE SLEEP APNEA  Have you ever been diagnosed with sleep apnea through a sleep study? No  Do you snore loudly (loud enough to be heard through closed doors)?  0  Do you often feel tired, fatigued, or sleepy during the daytime (such as falling asleep during driving or talking to someone)? 0  Has anyone observed you stop breathing during your sleep? 0  Do you have, or are you being treated for high blood pressure? 1  BMI more than 35 kg/m2? 1  Age > 50 (1-yes) 1  Neck circumference greater than:Male 16 inches or larger, Male 17inches or larger? 1  Male Gender (Yes=1) 1  Obstructive Sleep Apnea Score 5

## 2018-12-12 ENCOUNTER — Encounter (HOSPITAL_COMMUNITY)
Admission: RE | Disposition: A | Discharge status: Home / Self Care | Payer: Self-pay | Source: Other Acute Inpatient Hospital | Attending: Surgery

## 2018-12-12 ENCOUNTER — Other Ambulatory Visit: Payer: Self-pay

## 2018-12-12 ENCOUNTER — Inpatient Hospital Stay (HOSPITAL_COMMUNITY): Payer: 59 | Admitting: Physician Assistant

## 2018-12-12 ENCOUNTER — Inpatient Hospital Stay (HOSPITAL_COMMUNITY)
Admission: RE | Admit: 2018-12-12 | Discharge: 2018-12-13 | DRG: 621 | Disposition: A | Discharge status: Home / Self Care | Payer: 59 | Source: Ambulatory Visit | Attending: Surgery | Admitting: Surgery

## 2018-12-12 ENCOUNTER — Encounter (HOSPITAL_COMMUNITY): Payer: Self-pay | Admitting: *Deleted

## 2018-12-12 ENCOUNTER — Inpatient Hospital Stay (HOSPITAL_COMMUNITY): Payer: 59 | Admitting: Certified Registered Nurse Anesthetist

## 2018-12-12 DIAGNOSIS — Z79899 Other long term (current) drug therapy: Secondary | ICD-10-CM | POA: Diagnosis not present

## 2018-12-12 DIAGNOSIS — Z9884 Bariatric surgery status: Secondary | ICD-10-CM

## 2018-12-12 DIAGNOSIS — Z20828 Contact with and (suspected) exposure to other viral communicable diseases: Secondary | ICD-10-CM | POA: Diagnosis present

## 2018-12-12 DIAGNOSIS — Z6841 Body Mass Index (BMI) 40.0 and over, adult: Secondary | ICD-10-CM | POA: Diagnosis not present

## 2018-12-12 DIAGNOSIS — Z01812 Encounter for preprocedural laboratory examination: Secondary | ICD-10-CM

## 2018-12-12 DIAGNOSIS — I10 Essential (primary) hypertension: Secondary | ICD-10-CM | POA: Diagnosis present

## 2018-12-12 HISTORY — PX: LAPAROSCOPIC GASTRIC SLEEVE RESECTION: SHX5895

## 2018-12-12 LAB — CBC
HCT: 41.7 % (ref 39.0–52.0)
Hemoglobin: 13.9 g/dL (ref 13.0–17.0)
MCH: 31.4 pg (ref 26.0–34.0)
MCHC: 33.3 g/dL (ref 30.0–36.0)
MCV: 94.3 fL (ref 80.0–100.0)
Platelets: 164 10*3/uL (ref 150–400)
RBC: 4.42 MIL/uL (ref 4.22–5.81)
RDW: 12.7 % (ref 11.5–15.5)
WBC: 9.9 10*3/uL (ref 4.0–10.5)
nRBC: 0 % (ref 0.0–0.2)

## 2018-12-12 LAB — TYPE AND SCREEN
ABO/RH(D): O POS
Antibody Screen: NEGATIVE

## 2018-12-12 LAB — CREATININE, SERUM
Creatinine, Ser: 1.06 mg/dL (ref 0.61–1.24)
GFR calc Af Amer: >60 mL/min (ref >60)
GFR calc non Af Amer: >60 mL/min (ref >60)

## 2018-12-12 LAB — HEMOGLOBIN AND HEMATOCRIT, BLOOD
HCT: 42.9 % (ref 39.0–52.0)
Hemoglobin: 14 g/dL (ref 13.0–17.0)

## 2018-12-12 LAB — ABO/RH: ABO/RH(D): O POS

## 2018-12-12 SURGERY — GASTRECTOMY, SLEEVE, LAPAROSCOPIC
Anesthesia: General | Site: Abdomen

## 2018-12-12 MED ORDER — PHENYLEPHRINE 40 MCG/ML (10ML) SYRINGE FOR IV PUSH (FOR BLOOD PRESSURE SUPPORT)
PREFILLED_SYRINGE | INTRAVENOUS | Status: DC | PRN
  Administered 2018-12-12 (×4): 80 ug via INTRAVENOUS

## 2018-12-12 MED ORDER — BUPIVACAINE LIPOSOME 1.3 % IJ SUSP
20.0000 mL | Freq: Once | INTRAMUSCULAR | Status: DC
  Filled 2018-12-12: qty 20

## 2018-12-12 MED ORDER — LACTATED RINGERS IV SOLN
INTRAVENOUS | Status: DC
  Administered 2018-12-12: 13:00:00 via INTRAVENOUS

## 2018-12-12 MED ORDER — BUPIVACAINE LIPOSOME 1.3 % IJ SUSP
INTRAMUSCULAR | Status: DC | PRN
  Administered 2018-12-12: 20 mL

## 2018-12-12 MED ORDER — FENTANYL CITRATE (PF) 250 MCG/5ML IJ SOLN
INTRAMUSCULAR | Status: AC
  Filled 2018-12-12: qty 5

## 2018-12-12 MED ORDER — FENTANYL CITRATE (PF) 100 MCG/2ML IJ SOLN
25.0000 ug | INTRAMUSCULAR | Status: DC | PRN
  Administered 2018-12-12 (×2): 50 ug via INTRAVENOUS

## 2018-12-12 MED ORDER — ONDANSETRON HCL 4 MG/2ML IJ SOLN: 4.0000 mg | INTRAMUSCULAR | Status: DC | PRN

## 2018-12-12 MED ORDER — OXYCODONE HCL 5 MG PO TABS: 5.0000 mg | ORAL_TABLET | Freq: Once | ORAL | Status: DC | PRN

## 2018-12-12 MED ORDER — ROCURONIUM BROMIDE 50 MG/5ML IV SOSY
PREFILLED_SYRINGE | INTRAVENOUS | Status: DC | PRN
  Administered 2018-12-12: 30 mg via INTRAVENOUS
  Administered 2018-12-12: 20 mg via INTRAVENOUS
  Administered 2018-12-12: 50 mg via INTRAVENOUS
  Administered 2018-12-12: 20 mg via INTRAVENOUS

## 2018-12-12 MED ORDER — ROCURONIUM BROMIDE 10 MG/ML (PF) SYRINGE
PREFILLED_SYRINGE | INTRAVENOUS | Status: AC
  Filled 2018-12-12: qty 10

## 2018-12-12 MED ORDER — FENTANYL CITRATE (PF) 100 MCG/2ML IJ SOLN
INTRAMUSCULAR | Status: DC | PRN
  Administered 2018-12-12: 50 ug via INTRAVENOUS

## 2018-12-12 MED ORDER — ONDANSETRON HCL 4 MG/2ML IJ SOLN
INTRAMUSCULAR | Status: DC | PRN
  Administered 2018-12-12: 4 mg via INTRAVENOUS

## 2018-12-12 MED ORDER — FENTANYL CITRATE (PF) 100 MCG/2ML IJ SOLN: 25.0000 ug | INTRAMUSCULAR | Status: DC | PRN

## 2018-12-12 MED ORDER — SUCCINYLCHOLINE CHLORIDE 200 MG/10ML IV SOSY
PREFILLED_SYRINGE | INTRAVENOUS | Status: DC | PRN
  Administered 2018-12-12: 160 mg via INTRAVENOUS

## 2018-12-12 MED ORDER — ACETAMINOPHEN 500 MG PO TABS
1000.0000 mg | ORAL_TABLET | Freq: Three times a day (TID) | ORAL | Status: DC
  Administered 2018-12-13: 1000 mg via ORAL
  Filled 2018-12-12: qty 2

## 2018-12-12 MED ORDER — HYDRALAZINE HCL 20 MG/ML IJ SOLN: 10.0000 mg | INTRAMUSCULAR | Status: DC | PRN

## 2018-12-12 MED ORDER — OXYCODONE HCL 5 MG/5ML PO SOLN: 5.0000 mg | Freq: Once | ORAL | Status: DC | PRN

## 2018-12-12 MED ORDER — EPHEDRINE SULFATE 50 MG/ML IJ SOLN
INTRAMUSCULAR | Status: DC | PRN
  Administered 2018-12-12 (×2): 10 mg via INTRAVENOUS
  Administered 2018-12-12 (×2): 5 mg via INTRAVENOUS

## 2018-12-12 MED ORDER — FENTANYL CITRATE (PF) 100 MCG/2ML IJ SOLN
INTRAMUSCULAR | Status: AC
  Filled 2018-12-12: qty 2

## 2018-12-12 MED ORDER — PANTOPRAZOLE SODIUM 40 MG IV SOLR
40.0000 mg | Freq: Every day | INTRAVENOUS | Status: DC
  Administered 2018-12-12: 40 mg via INTRAVENOUS
  Filled 2018-12-12: qty 40

## 2018-12-12 MED ORDER — PROPOFOL 10 MG/ML IV BOLUS
INTRAVENOUS | Status: AC
  Filled 2018-12-12: qty 20

## 2018-12-12 MED ORDER — MIDAZOLAM HCL 5 MG/5ML IJ SOLN
INTRAMUSCULAR | Status: DC | PRN
  Administered 2018-12-12: 2 mg via INTRAVENOUS

## 2018-12-12 MED ORDER — PROPOFOL 10 MG/ML IV BOLUS
INTRAVENOUS | Status: DC | PRN
  Administered 2018-12-12: 200 mg via INTRAVENOUS

## 2018-12-12 MED ORDER — CHLORHEXIDINE GLUCONATE CLOTH 2 % EX PADS: 6.0000 | MEDICATED_PAD | Freq: Once | CUTANEOUS | Status: DC

## 2018-12-12 MED ORDER — KCL IN DEXTROSE-NACL 20-5-0.45 MEQ/L-%-% IV SOLN
INTRAVENOUS | Status: DC
  Administered 2018-12-12: 19:00:00 via INTRAVENOUS
  Filled 2018-12-12 (×3): qty 1000

## 2018-12-12 MED ORDER — KETAMINE HCL 10 MG/ML IJ SOLN
INTRAMUSCULAR | Status: AC
  Filled 2018-12-12: qty 1

## 2018-12-12 MED ORDER — SUGAMMADEX SODIUM 500 MG/5ML IV SOLN
INTRAVENOUS | Status: DC | PRN
  Administered 2018-12-12: 500 mg via INTRAVENOUS

## 2018-12-12 MED ORDER — ENSURE MAX PROTEIN PO LIQD
2.0000 [oz_av] | ORAL | Status: DC
  Administered 2018-12-13 (×4): 2 [oz_av] via ORAL

## 2018-12-12 MED ORDER — HEPARIN SODIUM (PORCINE) 5000 UNIT/ML IJ SOLN
5000.0000 [IU] | Freq: Three times a day (TID) | INTRAMUSCULAR | Status: DC
  Administered 2018-12-12 – 2018-12-13 (×3): 5000 [IU] via SUBCUTANEOUS
  Filled 2018-12-12 (×3): qty 1

## 2018-12-12 MED ORDER — ACETAMINOPHEN 160 MG/5ML PO SOLN
1000.0000 mg | Freq: Three times a day (TID) | ORAL | Status: DC
  Administered 2018-12-12 – 2018-12-13 (×2): 1000 mg via ORAL
  Filled 2018-12-12 (×2): qty 40.6

## 2018-12-12 MED ORDER — SODIUM CHLORIDE (PF) 0.9 % IJ SOLN
INTRAMUSCULAR | Status: AC
  Filled 2018-12-12: qty 10

## 2018-12-12 MED ORDER — SUGAMMADEX SODIUM 500 MG/5ML IV SOLN
INTRAVENOUS | Status: AC
  Filled 2018-12-12: qty 5

## 2018-12-12 MED ORDER — LIDOCAINE 2% (20 MG/ML) 5 ML SYRINGE
INTRAMUSCULAR | Status: DC | PRN
  Administered 2018-12-12: 100 mg via INTRAVENOUS

## 2018-12-12 MED ORDER — SODIUM CHLORIDE 0.9 % IV SOLN
2.0000 g | INTRAVENOUS | Status: AC
  Administered 2018-12-12: 2 g via INTRAVENOUS
  Filled 2018-12-12: qty 2

## 2018-12-12 MED ORDER — 0.9 % SODIUM CHLORIDE (POUR BTL) OPTIME
TOPICAL | Status: DC | PRN
  Administered 2018-12-12: 1000 mL

## 2018-12-12 MED ORDER — LIDOCAINE 2% (20 MG/ML) 5 ML SYRINGE
INTRAMUSCULAR | Status: AC
  Filled 2018-12-12: qty 15

## 2018-12-12 MED ORDER — MORPHINE SULFATE (PF) 4 MG/ML IV SOLN
1.0000 mg | INTRAVENOUS | Status: DC | PRN
  Administered 2018-12-12 (×2): 3 mg via INTRAVENOUS
  Administered 2018-12-13: 2 mg via INTRAVENOUS
  Filled 2018-12-12 (×3): qty 1

## 2018-12-12 MED ORDER — LACTATED RINGERS IV SOLN
INTRAVENOUS | Status: DC | PRN
  Administered 2018-12-12: 14:00:00 via INTRAVENOUS

## 2018-12-12 MED ORDER — GLYCOPYRROLATE 0.2 MG/ML IJ SOLN
INTRAMUSCULAR | Status: DC | PRN
  Administered 2018-12-12 (×2): 0.2 mg via INTRAVENOUS

## 2018-12-12 MED ORDER — NADOLOL 40 MG PO TABS
40.0000 mg | ORAL_TABLET | Freq: Every day | ORAL | Status: DC
  Filled 2018-12-12: qty 1

## 2018-12-12 MED ORDER — ONDANSETRON HCL 4 MG/2ML IJ SOLN: 4.0000 mg | Freq: Once | INTRAMUSCULAR | Status: DC | PRN

## 2018-12-12 MED ORDER — ACETAMINOPHEN 500 MG PO TABS
1000.0000 mg | ORAL_TABLET | ORAL | Status: AC
  Administered 2018-12-12: 1000 mg via ORAL
  Filled 2018-12-12: qty 2

## 2018-12-12 MED ORDER — SODIUM CHLORIDE (PF) 0.9 % IJ SOLN
INTRAMUSCULAR | Status: DC | PRN
  Administered 2018-12-12: 10 mL

## 2018-12-12 MED ORDER — OXYCODONE HCL 5 MG/5ML PO SOLN
5.0000 mg | Freq: Four times a day (QID) | ORAL | Status: DC | PRN
  Administered 2018-12-13 (×2): 5 mg via ORAL
  Filled 2018-12-12 (×3): qty 5

## 2018-12-12 MED ORDER — APREPITANT 40 MG PO CAPS
40.0000 mg | ORAL_CAPSULE | ORAL | Status: AC
  Administered 2018-12-12: 40 mg via ORAL
  Filled 2018-12-12: qty 1

## 2018-12-12 MED ORDER — LIDOCAINE 2% (20 MG/ML) 5 ML SYRINGE
INTRAMUSCULAR | Status: DC | PRN
  Administered 2018-12-12: 1.5 mg/kg/h via INTRAVENOUS

## 2018-12-12 MED ORDER — PHENYLEPHRINE 40 MCG/ML (10ML) SYRINGE FOR IV PUSH (FOR BLOOD PRESSURE SUPPORT)
PREFILLED_SYRINGE | INTRAVENOUS | Status: AC
  Filled 2018-12-12: qty 10

## 2018-12-12 MED ORDER — KETAMINE HCL 10 MG/ML IJ SOLN
INTRAMUSCULAR | Status: DC | PRN
  Administered 2018-12-12: 35 mg via INTRAVENOUS
  Administered 2018-12-12: 10 mg via INTRAVENOUS

## 2018-12-12 MED ORDER — SCOPOLAMINE 1 MG/3DAYS TD PT72
1.0000 | MEDICATED_PATCH | TRANSDERMAL | Status: DC
  Administered 2018-12-12: 1.5 mg via TRANSDERMAL
  Filled 2018-12-12: qty 1

## 2018-12-12 MED ORDER — HYDROCHLOROTHIAZIDE 25 MG PO TABS: 25.0000 mg | ORAL_TABLET | Freq: Every day | ORAL | Status: DC

## 2018-12-12 MED ORDER — GABAPENTIN 300 MG PO CAPS
300.0000 mg | ORAL_CAPSULE | ORAL | Status: AC
  Administered 2018-12-12: 300 mg via ORAL
  Filled 2018-12-12: qty 1

## 2018-12-12 MED ORDER — DEXAMETHASONE SODIUM PHOSPHATE 10 MG/ML IJ SOLN
INTRAMUSCULAR | Status: DC | PRN
  Administered 2018-12-12: 10 mg via INTRAVENOUS

## 2018-12-12 MED ORDER — MIDAZOLAM HCL 2 MG/2ML IJ SOLN
INTRAMUSCULAR | Status: AC
  Filled 2018-12-12: qty 2

## 2018-12-12 MED ORDER — LACTATED RINGERS IR SOLN
Status: DC | PRN
  Administered 2018-12-12: 1000 mL

## 2018-12-12 MED ORDER — HEPARIN SODIUM (PORCINE) 5000 UNIT/ML IJ SOLN
5000.0000 [IU] | INTRAMUSCULAR | Status: AC
  Administered 2018-12-12: 5000 [IU] via SUBCUTANEOUS
  Filled 2018-12-12: qty 1

## 2018-12-12 MED ORDER — ONDANSETRON HCL 4 MG/2ML IJ SOLN
INTRAMUSCULAR | Status: AC
  Filled 2018-12-12: qty 2

## 2018-12-12 SURGICAL SUPPLY — 66 items
APPLICATOR COTTON TIP 6 STRL (MISCELLANEOUS) IMPLANT
APPLICATOR COTTON TIP 6IN STRL (MISCELLANEOUS) ×2 IMPLANT
APPLIER CLIP 5 13 M/L LIGAMAX5 (MISCELLANEOUS)
APPLIER CLIP ROT 10 11.4 M/L (STAPLE)
APPLIER CLIP ROT 13.4 12 LRG (CLIP)
BLADE SURG 15 STRL LF DISP TIS (BLADE) ×1 IMPLANT
BLADE SURG 15 STRL SS (BLADE) ×1
CABLE HIGH FREQUENCY MONO STRZ (ELECTRODE) ×1 IMPLANT
CLIP APPLIE 5 13 M/L LIGAMAX5 (MISCELLANEOUS) IMPLANT
CLIP APPLIE ROT 10 11.4 M/L (STAPLE) IMPLANT
CLIP APPLIE ROT 13.4 12 LRG (CLIP) IMPLANT
COVER WAND RF STERILE (DRAPES) IMPLANT
DERMABOND ADVANCED (GAUZE/BANDAGES/DRESSINGS) ×1
DERMABOND ADVANCED .7 DNX12 (GAUZE/BANDAGES/DRESSINGS) IMPLANT
DEVICE SUT QUICK LOAD TK 5 (STAPLE) IMPLANT
DEVICE SUT TI-KNOT TK 5X26 (MISCELLANEOUS) IMPLANT
DEVICE SUTURE ENDOST 10MM (ENDOMECHANICALS) IMPLANT
DISSECTOR BLUNT TIP ENDO 5MM (MISCELLANEOUS) IMPLANT
ELECT REM PT RETURN 15FT ADLT (MISCELLANEOUS) ×2 IMPLANT
GAUZE SPONGE 4X4 12PLY STRL (GAUZE/BANDAGES/DRESSINGS) IMPLANT
GLOVE BIO SURGEON STRL SZ 6 (GLOVE) ×1 IMPLANT
GLOVE BIOGEL M 8.0 STRL (GLOVE) ×2 IMPLANT
GLOVE BIOGEL PI IND STRL 6.5 (GLOVE) IMPLANT
GLOVE BIOGEL PI IND STRL 7.0 (GLOVE) IMPLANT
GLOVE BIOGEL PI INDICATOR 6.5 (GLOVE) ×1
GLOVE BIOGEL PI INDICATOR 7.0 (GLOVE) ×1
GLOVE SURG SS PI 7.0 STRL IVOR (GLOVE) ×1 IMPLANT
GOWN STRL REUS W/TWL XL LVL3 (GOWN DISPOSABLE) ×8 IMPLANT
GRASPER SUT TROCAR 14GX15 (MISCELLANEOUS) ×2 IMPLANT
HANDLE STAPLE EGIA 4 XL (STAPLE) ×2 IMPLANT
HOVERMATT SINGLE USE (MISCELLANEOUS) ×2 IMPLANT
KIT BASIN OR (CUSTOM PROCEDURE TRAY) ×2 IMPLANT
KIT TURNOVER KIT A (KITS) IMPLANT
MARKER SKIN DUAL TIP RULER LAB (MISCELLANEOUS) ×2 IMPLANT
NDL SPNL 22GX3.5 QUINCKE BK (NEEDLE) ×1 IMPLANT
NEEDLE SPNL 22GX3.5 QUINCKE BK (NEEDLE) ×2 IMPLANT
PACK UNIVERSAL I (CUSTOM PROCEDURE TRAY) ×2 IMPLANT
RELOAD STAPLE 45 PURP MED/THCK (STAPLE) IMPLANT
RELOAD TRI 45 ART MED THCK BLK (STAPLE) ×2 IMPLANT
RELOAD TRI 45 ART MED THCK PUR (STAPLE) IMPLANT
RELOAD TRI 60 ART MED THCK BLK (STAPLE) ×2 IMPLANT
RELOAD TRI 60 ART MED THCK PUR (STAPLE) ×4 IMPLANT
SCISSORS LAP 5X45 EPIX DISP (ENDOMECHANICALS) ×1 IMPLANT
SET IRRIG TUBING LAPAROSCOPIC (IRRIGATION / IRRIGATOR) ×2 IMPLANT
SET TUBE SMOKE EVAC HIGH FLOW (TUBING) ×2 IMPLANT
SHEARS HARMONIC ACE PLUS 45CM (MISCELLANEOUS) ×2 IMPLANT
SLEEVE ADV FIXATION 5X100MM (TROCAR) ×4 IMPLANT
SLEEVE GASTRECTOMY 36FR VISIGI (MISCELLANEOUS) ×2 IMPLANT
SOL ANTI FOG 6CC (MISCELLANEOUS) ×1 IMPLANT
SOLUTION ANTI FOG 6CC (MISCELLANEOUS) ×1
SPONGE LAP 18X18 RF (DISPOSABLE) ×2 IMPLANT
STAPLER VISISTAT 35W (STAPLE) ×2 IMPLANT
SUT MNCRL AB 4-0 PS2 18 (SUTURE) ×4 IMPLANT
SUT SURGIDAC NAB ES-9 0 48 120 (SUTURE) IMPLANT
SUT VICRYL 0 TIES 12 18 (SUTURE) ×2 IMPLANT
SYR 10ML ECCENTRIC (SYRINGE) ×2 IMPLANT
SYR 20ML LL LF (SYRINGE) ×2 IMPLANT
SYR 50ML LL SCALE MARK (SYRINGE) ×2 IMPLANT
TOWEL OR 17X26 10 PK STRL BLUE (TOWEL DISPOSABLE) ×4 IMPLANT
TOWEL OR NON WOVEN STRL DISP B (DISPOSABLE) ×2 IMPLANT
TROCAR ADV FIXATION 5X100MM (TROCAR) ×2 IMPLANT
TROCAR BLADELESS 15MM (ENDOMECHANICALS) ×2 IMPLANT
TROCAR BLADELESS OPT 5 100 (ENDOMECHANICALS) ×2 IMPLANT
TUBE CALIBRATION LAPBAND (TUBING) IMPLANT
TUBING CONNECTING 10 (TUBING) ×2 IMPLANT
TUBING ENDO SMARTCAP (MISCELLANEOUS) ×2 IMPLANT

## 2018-12-12 NOTE — Anesthesia Preprocedure Evaluation (Addendum)
Anesthesia Evaluation  Patient identified by MRN, date of birth, ID band Patient awake    Reviewed: Allergy & Precautions, NPO status , Patient's Chart, lab work & pertinent test results  Airway Mallampati: III  TM Distance: >3 FB Neck ROM: Full    Dental  (+) Teeth Intact, Dental Advisory Given   Pulmonary    breath sounds clear to auscultation       Cardiovascular hypertension,  Rhythm:Regular Rate:Normal     Neuro/Psych    GI/Hepatic   Endo/Other    Renal/GU      Musculoskeletal   Abdominal (+) + obese,   Peds  Hematology   Anesthesia Other Findings   Reproductive/Obstetrics                            Anesthesia Physical Anesthesia Plan  ASA: III  Anesthesia Plan: General   Post-op Pain Management:    Induction: Intravenous  PONV Risk Score and Plan: Ondansetron and Dexamethasone  Airway Management Planned: Oral ETT  Additional Equipment:   Intra-op Plan:   Post-operative Plan: Extubation in OR  Informed Consent: I have reviewed the patients History and Physical, chart, labs and discussed the procedure including the risks, benefits and alternatives for the proposed anesthesia with the patient or authorized representative who has indicated his/her understanding and acceptance.   Dental advisory given  Plan Discussed with: CRNA and Anesthesiologist  Anesthesia Plan Comments:        Anesthesia Quick Evaluation  

## 2018-12-12 NOTE — Anesthesia Postprocedure Evaluation (Signed)
Anesthesia Post Note  Patient: Seth Rogers  Procedure(s) Performed: LAPAROSCOPIC GASTRIC SLEEVE RESECTION, Upper Endo, ERAS Pathway (N/A Abdomen)     Patient location during evaluation: PACU Anesthesia Type: General Level of consciousness: awake and alert Pain management: pain level controlled Vital Signs Assessment: post-procedure vital signs reviewed and stable Respiratory status: spontaneous breathing, nonlabored ventilation, respiratory function stable and patient connected to nasal cannula oxygen Cardiovascular status: blood pressure returned to baseline and stable Postop Assessment: no apparent nausea or vomiting Anesthetic complications: no    Last Vitals:  Vitals:   12/12/18 1715 12/12/18 1730  BP: (!) 141/81 (!) 142/84  Pulse: (!) 44 (!) 41  Resp: 19 (!) 21  Temp:  (!) 36.3 C  SpO2: 93% 93%    Last Pain:  Vitals:   12/12/18 1730  TempSrc:   PainSc: 7                  Ayrabella Labombard COKER

## 2018-12-12 NOTE — Progress Notes (Signed)
PHARMACY CONSULT FOR:  Risk Assessment for Post-Discharge VTE Following Bariatric Surgery  Post-Discharge VTE Risk Assessment: This patient's probability of 30-day post-discharge VTE is increased due to the factors marked: X  Male    Age >/=60 years    BMI >/=50 kg/m2    CHF    Dyspnea at Rest    Paraplegia  X  Non-gastric-band surgery    Operation Time >/=3 hr    Return to OR     Length of Stay >/= 3 d      Hx of VTE   Hypercoagulable condition   Significant venous stasis   Predicted probability of 30-day post-discharge VTE: 0.31%  Other patient-specific factors to consider: Note various weights are reported on 9/29 with corresponding BMIs ranging from 46 to 52. Since a BMI > 50 would increase VTE risk above the 0.4% threshold, thus requiring Lovenox at discharge, I had RN re-weigh patient postop to confirm that BMI was in fact < 50 kg/m2. As a result, patient does NOT qualify for VTE ppx.   Recommendation for Discharge: . No pharmacologic prophylaxis post-discharge . Follow daily and recalculate estimated 30d VTE risk if returns to OR or LOS reaches 3 days.   Seth Rogers is a 58 y.o. male who underwent laparoscopic sleeve gastrectomy on 9/29   Case start: 1408 Case end: 1551   Allergies  Allergen Reactions  . Penicillins Rash    Patient Measurements: Height: 5\' 11"  (180.3 cm) Weight: (!) 329 lb 12.8 oz (149.6 kg) IBW/kg (Calculated) : 75.3 Body mass index is 46 kg/m.  Recent Labs    12/11/18 1019 12/11/18 1200 12/12/18 1708  WBC 7.6  --  9.9  HGB 14.7  --  13.9  HCT 44.8  --  41.7  PLT 203  --  164  CREATININE 0.99 1.02 1.06  ALBUMIN  --  4.7  --   PROT  --  7.7  --   AST  --  20  --   ALT  --  19  --   ALKPHOS  --  46  --   BILITOT  --  2.1*  --    Estimated Creatinine Clearance: 114.2 mL/min (by C-G formula based on SCr of 1.06 mg/dL).    Past Medical History:  Diagnosis Date  . Hypertension    dx  about 15 years  ago       Medications Prior to Admission  Medication Sig Dispense Refill Last Dose  . acetaminophen (TYLENOL) 500 MG tablet Take 1,000 mg by mouth every 6 (six) hours as needed (for pain.).   Past Week at Unknown time  . hydrochlorothiazide (HYDRODIURIL) 25 MG tablet Take 25 mg by mouth daily.  3 12/11/2018 at Unknown time  . nadolol (CORGARD) 40 MG tablet Take 40 mg by mouth daily.  3 12/12/2018 at 0830  . telmisartan (MICARDIS) 80 MG tablet Take 80 mg by mouth daily.   12/11/2018 at Unknown time      Seth Rogers, PharmD, BCPS [phone] 12/12/2018, 6:41 PM

## 2018-12-12 NOTE — Op Note (Signed)
Name:  Seth Rogers MRN: [id number] Date of Surgery: 12/12/2018  Preop Diagnosis:  Morbid Obesity  Postop Diagnosis:  Morbid Obesity (Weight - 154 kg, BMI - 47.6), S/P Gastric Sleeve resection  Procedure:  Upper endoscopy  (Intraoperative)  Surgeon:  Alphonsa Overall, M.D.  Anesthesia:  GET  Indications for procedure: Seth Rogers is a 58 y.o. male whose primary care physician is Kristopher Glee., MD and has completed a gastric sleeve resection today for weight loss by Dr. Hassell Done.  I am doing an intraoperative upper endoscopy to evaluate the gastric pouch after the sleeve gastrectomy.  Operative Note: The patient is under general anesthesia.  Dr. Hassell Done is laparoscoping the patient while I do an upper endoscopy to evaluate the stomach pouch.  With the patient intubated, I passed the Olympus upper endoscope without difficulty down the esophagus.  The esophagus was unremarkable.  The esophago-gastric junction was at 40 cm.    The mucosa of the stomach looked viable and the staple line was intact without bleeding.  I advanced the scope to the pylorus, but did not go through it.  While I insufflated the stomach pouch with air, Dr. Hassell Done flooded the upper abdomen with saline to put the gastric pouch under saline.  There was no bubbling or evidence of a leak.  There was no evidence of narrowing of the pouch and the gastric sleeve looked tubular.  The scope was then withdrawn.  The esophagus was unremarkable and the patient tolerated the endoscopy without difficulty.  Alphonsa Overall, MD, [facility] Surgery Pager: [phone] Office phone:  [phone]

## 2018-12-12 NOTE — Interval H&P Note (Signed)
History and Physical Interval Note:  12/12/2018 12:52 PM  Seth Rogers  has presented today for surgery, with the diagnosis of Morbid Obesity, HTN.  The various methods of treatment have been discussed with the patient and family. After consideration of risks, benefits and other options for treatment, the patient has consented to  Procedure(s): LAPAROSCOPIC GASTRIC SLEEVE RESECTION, Upper Endo, ERAS Pathway (N/A) as a surgical intervention.  The patient's history has been reviewed, patient examined, no change in status, stable for surgery.  I have reviewed the patient's chart and labs.  Questions were answered to the patient's satisfaction.     Pedro Earls

## 2018-12-12 NOTE — Anesthesia Procedure Notes (Signed)
Procedure Name: Intubation Date/Time: 12/12/2018 1:40 PM Performed by: West Pugh, CRNA Pre-anesthesia Checklist: Patient identified, Emergency Drugs available, Suction available, Patient being monitored and Timeout performed Patient Re-evaluated:Patient Re-evaluated prior to induction Oxygen Delivery Method: Circle system utilized Preoxygenation: Pre-oxygenation with 100% oxygen Induction Type: IV induction, Cricoid Pressure applied and Rapid sequence Laryngoscope Size: Mac and 3 Grade View: Grade I Tube type: Oral Tube size: 7.5 mm Number of attempts: 1 Airway Equipment and Method: Stylet Placement Confirmation: ETT inserted through vocal cords under direct vision,  positive ETCO2,  CO2 detector and breath sounds checked- equal and bilateral Secured at: 22 cm Tube secured with: Tape Dental Injury: Teeth and Oropharynx as per pre-operative assessment

## 2018-12-12 NOTE — Discharge Instructions (Signed)
° ° ° °GASTRIC BYPASS/SLEEVE ° Home Care Instructions ° ° These instructions are to help you care for yourself when you go home. ° °Call: If you have any problems. °• Call [phone] and ask for the surgeon on call °• If you need immediate help, come to the ER at [facility].  °• Tell the ER staff that you are a new post-op gastric bypass or gastric sleeve patient °  °Signs and symptoms to report: • Severe vomiting or nausea °o If you cannot keep down clear liquids for longer than 1 day, call your surgeon  °• Abdominal pain that does not get better after taking your pain medication °• Fever over 100.4° F with chills °• Heart beating over 100 beats a minute °• Shortness of breath at rest °• Chest pain °•  Redness, swelling, drainage, or foul odor at incision (surgical) sites °•  If your incisions open or pull apart °• Swelling or pain in calf (lower leg) °• Diarrhea (Loose bowel movements that happen often), frequent watery, uncontrolled bowel movements °• Constipation, (no bowel movements for 3 days) if this happens: Pick one °o Milk of Magnesia, 2 tablespoons by mouth, 3 times a day for 2 days if needed °o Stop taking Milk of Magnesia once you have a bowel movement °o Call your doctor if constipation continues °Or °o Miralax  (instead of Milk of Magnesia) following the label instructions °o Stop taking Miralax once you have a bowel movement °o Call your doctor if constipation continues °• Anything you think is not normal °  °Normal side effects after surgery: • Unable to sleep at night or unable to focus °• Irritability or moody °• Being tearful (crying) or depressed °These are common complaints, possibly related to your anesthesia medications that put you to sleep, stress of surgery, and change in lifestyle.  This usually goes away a few weeks after surgery.  If these feelings continue, call your primary care doctor. °  °Wound Care: You may have surgical glue, steri-strips, or staples over your incisions after  surgery °• Surgical glue:  Looks like a clear film over your incisions and will wear off a little at a time °• Steri-strips: Strips of tape over your incisions. You may notice a yellowish color on the skin under the steri-strips. This is used to make the   steri-strips stick better. Do not pull the steri-strips off - let them fall off °• Staples: Staples may be removed before you leave the hospital °o If you go home with staples, call [facility], [phone] at for an appointment with your surgeon’s nurse to have staples removed 10 days after surgery. °• Showering: You may shower two (2) days after your surgery unless your surgeon tells you differently °o Wash gently around incisions with warm soapy water, rinse well, and gently pat dry  °o No tub baths until staples are removed, steri-strips fall off or glue is gone.  °  °Medications: • Medications should be liquid or crushed if larger than the size of a dime °• Extended release pills (medication that release a little bit at a time through the day) should NOT be crushed or cut. (examples include XL, ER, DR, SR) °• Depending on the size and number of medications you take, you may need to space (take a few throughout the day)/change the time you take your medications so that you do not over-fill your pouch (smaller stomach) °• Make sure you follow-up with your primary care doctor to   make medication changes needed during rapid weight loss and life-style changes °• If you have diabetes, follow up with the doctor that orders your diabetes medication(s) within one week after surgery and check your blood sugar regularly. °• Do not drive while taking prescription pain medication  °• It is ok to take Tylenol by the bottle instructions with your pain medicine or instead of your pain medicine as needed.  DO NOT TAKE NSAIDS (EXAMPLES OF NSAIDS:  IBUPROFREN/ NAPROXEN)  °Diet:                    First 2 Weeks ° You will see the dietician t about two (2) weeks  after your surgery. The dietician will increase the types of foods you can eat if you are handling liquids well: °• If you have severe vomiting or nausea and cannot keep down clear liquids lasting longer than 1 day, call your surgeon @ ([phone]) °Protein Shake °• Drink at least 2 ounces of shake 5-6 times per day °• Each serving of protein shakes (usually 8 - 12 ounces) should have: °o 15 grams of protein  °o And no more than 5 grams of carbohydrate  °• Goal for protein each day: °o Men = 80 grams per day °o Women = 60 grams per day °• Protein powder may be added to fluids such as non-fat milk or Lactaid milk or unsweetened Soy/Almond milk (limit to 35 grams added protein powder per serving) ° °Hydration °• Slowly increase the amount of water and other clear liquids as tolerated (See Acceptable Fluids) °• Slowly increase the amount of protein shake as tolerated  °•  Sip fluids slowly and throughout the day.  Do not use straws. °• May use sugar substitutes in small amounts (no more than 6 - 8 packets per day; i.e. Splenda) ° °Fluid Goal °• The first goal is to drink at least 8 ounces of protein shake/drink per day (or as directed by the nutritionist); some examples of protein shakes are Syntrax Nectar, Adkins Advantage, EAS Edge HP, and Unjury. See handout from pre-op Bariatric Education Class: °o Slowly increase the amount of protein shake you drink as tolerated °o You may find it easier to slowly sip shakes throughout the day °o It is important to get your proteins in first °• Your fluid goal is to drink 64 - 100 ounces of fluid daily °o It may take a few weeks to build up to this °• 32 oz (or more) should be clear liquids  °And  °• 32 oz (or more) should be full liquids (see below for examples) °• Liquids should not contain sugar, caffeine, or carbonation ° °Clear Liquids: °• Water or Sugar-free flavored water (i.e. Fruit H2O, Propel) °• Decaffeinated coffee or tea (sugar-free) °• Crystal Lite, Wyler’s Lite,  Minute Maid Lite °• Sugar-free Jell-O °• Bouillon or broth °• Sugar-free Popsicle:   *Less than 20 calories each; Limit 1 per day ° °Full Liquids: °Protein Shakes/Drinks + 2 choices per day of other full liquids °• Full liquids must be: °o No More Than 15 grams of Carbs per serving  °o No More Than 3 grams of Fat per serving °• Strained low-fat cream soup (except Cream of Potato or Tomato) °• Non-Fat milk °• Fat-free Lactaid Milk °• Unsweetened Soy Or Unsweetened Almond Milk °• Low Sugar yogurt (Dannon Lite & Fit, Greek yogurt; Oikos Triple Zero; Chobani Simply 100; Yoplait 100 calorie Greek - No Fruit on the Bottom) ° °  °Vitamins   and Minerals • Start 1 day after surgery unless otherwise directed by your surgeon °• 2 Chewable Bariatric Specific Multivitamin / Multimineral Supplement with iron (Example: Bariatric Advantage Multi EA) °• Chewable Calcium with Vitamin D-3 °(Example: 3 Chewable Calcium Plus 600 with Vitamin D-3) °o Take 500 mg three (3) times a day for a total of 1500 mg each day °o Do not take all 3 doses of calcium at one time as it may cause constipation, and you can only absorb 500 mg  at a time  °o Do not mix multivitamins containing iron with calcium supplements; take 2 hours apart °• Menstruating women and those with a history of anemia (a blood disease that causes weakness) may need extra iron °o Talk with your doctor to see if you need more iron °• Do not stop taking or change any vitamins or minerals until you talk to your dietitian or surgeon °• Your Dietitian and/or surgeon must approve all vitamin and mineral supplements °  °Activity and Exercise: Limit your physical activity as instructed by your doctor.  It is important to continue walking at home.  During this time, use these guidelines: °• Do not lift anything greater than ten (10) pounds for at least two (2) weeks °• Do not go back to work or drive until your surgeon says you can °• You may have sex when you feel comfortable  °o It is  VERY important for male patients to use a reliable birth control method; fertility often increases after surgery  °o All hormonal birth control will be ineffective for 30 days after surgery due to medications given during surgery a barrier method must be used. °o Do not get pregnant for at least 18 months °• Start exercising as soon as your doctor tells you that you can °o Make sure your doctor approves any physical activity °• Start with a simple walking program °• Walk 5-15 minutes each day, 7 days per week.  °• Slowly increase until you are walking 30-45 minutes per day °Consider joining our BELT program. [phone] or email [email or web address] °  °Special Instructions Things to remember: °• Use your CPAP when sleeping if this applies to you ° °• [facility] has two free Bariatric Surgery Support Groups that meet monthly °o The 3rd Thursday of each month, 6 pm, [facility]  °o The 2nd Friday of each month, 11:45 am in the private dining room in the basement of [facility] °• It is very important to keep all follow up appointments with your surgeon, dietitian, primary care physician, and behavioral health practitioner °• Routine follow up schedule with your surgeon include appointments at 2-3 weeks, 6-8 weeks, 6 months, and 1 year at a minimum.  Your surgeon may request to see you more often.   °o After the first year, please follow up with your bariatric surgeon and dietitian at least once a year in order to maintain best weight loss results °[facility]: [phone] °[facility]: [phone] °Bariatric Nurse Coordinator: [phone] °  °   Reviewed and Endorsed  °by [facility] Patient Education Committee, June, 2016 °Edits Approved: Aug, 2018 ° ° ° °

## 2018-12-12 NOTE — Transfer of Care (Signed)
Immediate Anesthesia Transfer of Care Note  Patient: Seth Rogers  Procedure(s) Performed: LAPAROSCOPIC GASTRIC SLEEVE RESECTION, Upper Endo, ERAS Pathway (N/A Abdomen)  Patient Location: PACU  Anesthesia Type:General  Level of Consciousness: awake, alert  and patient cooperative  Airway & Oxygen Therapy: Patient Spontanous Breathing and Patient connected to face mask oxygen  Post-op Assessment: Report given to RN and Post -op Vital signs reviewed and stable  Post vital signs: Reviewed and stable  Last Vitals:  Vitals Value Taken Time  BP 141/93 12/12/18 1603  Temp    Pulse 54 12/12/18 1607  Resp 24 12/12/18 1607  SpO2 97 % 12/12/18 1607  Vitals shown include unvalidated device data.  Last Pain:  Vitals:   12/12/18 1603  TempSrc:   PainSc: (P) 0-No pain         Complications: No apparent anesthesia complications

## 2018-12-12 NOTE — H&P (Signed)
Gwenyth Ober Documented: 10/25/2018 9:34 AM Location: [facility] Patient #: [id number] DOB: 05-Oct-1960 Married / Language: Cleophus Molt / Race: White Male   History of Present Illness Rodman Key B. Hassell Done MD; 10/25/2018 10:21 AM) The patient is a 58 year old male who presents for a bariatric surgery evaluation. Mr. and Mrs. Brunsman came for a preop visit. He had seen Dr. Excell Seltzer earlier this year. His UGI was negative and he doesn't have GERD. He has lost ~40 lbs getting ready for the operation . Today he is 89 with a BMI of 52--down from 408. His wife says that he doesn't snore anymore. 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  We discussed the difference between simple carbs (bad) and complex carbs (good) and the glycemic index of foods. I don't think that we should hold up his surgery for a OSA test but he does need a psych evaluation.      Allergies Malachi Bonds, CMA; 10/25/2018 9:37 AM) No Known Drug Allergies  [03/16/2018]:  Medication History Malachi Bonds, CMA; 10/25/2018 9:37 AM) hydroCHLOROthiazide (25MG  Tablet, Oral) Active. Nadolol (40MG  Tablet, Oral) Active. Telmisartan (80MG  Tablet, Oral) Active. Medications Reconciled  Vitals (Chemira Jones CMA; 10/25/2018 9:38 AM) 10/25/2018 9:36 AM Weight: 371.6 lb Height: 71in Body Surface Area: 2.74 m Body Mass Index: 51.83 kg/m  Temp.: 97.71F (Oral)  Pulse: 60 (Regular)  BP: 130/80(Sitting, Left Arm, Standard) Height re-measured with shoes off at patient request.. CJ      Physical Exam Rodman Key B. Hassell Done MD; 10/25/2018 10:22 AM) General Note: Obese WM with centripedal obesity HEENT unremarkable Neck supple Chest clear Heart SR Abdomen most of his obesity; he has had a lap chole and a midline umbilical scar from hernia repair. Ext FROM and no hx of DVT     Assessment & Plan  Rodman Key B. Hassell Done MD; 10/25/2018 10:24 AM)  MORBID OBESITY, UNSPECIFIED OBESITY TYPE (E66.01)  Plan:   Lap sleeve gastrectomy  Matt B. Hassell Done, MD, FACS

## 2018-12-12 NOTE — Op Note (Signed)
   Sept 29. 2020  Surgeon: Kaylyn Lim, MD, FACS  Asst:  Alphonsa Overall, MD, FACS  Anes:  General endotracheal  Procedure: Laparoscopic sleeve gastrectomy and upper endoscopy; takedown adhesions from Parietex mesh placement  Diagnosis: Morbid obesity  Complications: None noted  EBL:   15 cc  Description of Procedure:  The patient was take to OR 1 and given general anesthesia.  The abdomen was prepped with Technicare and draped sterilely.  A timeout was performed.  Access to the abdomen was achieved with a 5 mm Optiview through a left upper quadrant incision.  A total of 7 trocars including the 15 mm in the right abdomen were used because of his obesity and how large his stomach was.  .  Following insufflation, the state of the abdomen was found to be with adhesions to the Parietex mesh.  These were taken down with the Harmonic scalpel and did not involve bowel.  .  The ViSiGi 36Fr tube was inserted to deflate the stomach and was pulled back into the esophagus.    The pylorus was identified and we measured 5 cm back and marked the antrum.  At that point we began dissection to take down the greater curvature of the stomach using the Harmonic scalpel.  This dissection was taken all the way up to the left crus.  Posterior attachments of the stomach were also taken down.    The ViSiGi tube was then passed into the antrum and suction applied so that it was snug along the lessor curvature.  The "crow's foot" or incisura was identified.  The sleeve gastrectomy was begun using the [employer] stapler beginning with a 4.5 black with TRS and then a 6 cm black with TRS and then multiple purple loads with TRS.  When the sleeve was complete the tube was taken off suction and insufflated briefly and it appeared cylindrical.  The excised stomach was removed and a single 0 vicryl was used to close the defect.   The VisiG was withdrawn.  Upper endoscopy was then performed by Dr. Lucia Gaskins.     The  specimen was extracted through the 15 trocar site.  Local was provided by infiltrating with the TAP block with Exparel and closed 4-0 Monocryl and Dermabond.    Seth Rogers Done, [place], [facility], [place]

## 2018-12-13 ENCOUNTER — Encounter (HOSPITAL_COMMUNITY): Payer: Self-pay | Admitting: Surgery

## 2018-12-13 LAB — CBC WITH DIFFERENTIAL/PLATELET
Abs Immature Granulocytes: 0.03 10*3/uL (ref 0.00–0.07)
Basophils Absolute: 0 10*3/uL (ref 0.0–0.1)
Basophils Relative: 0 %
Eosinophils Absolute: 0 10*3/uL (ref 0.0–0.5)
Eosinophils Relative: 0 %
HCT: 41.1 % (ref 39.0–52.0)
Hemoglobin: 13.5 g/dL (ref 13.0–17.0)
Immature Granulocytes: 0 %
Lymphocytes Relative: 9 %
Lymphs Abs: 0.7 10*3/uL (ref 0.7–4.0)
MCH: 31.3 pg (ref 26.0–34.0)
MCHC: 32.8 g/dL (ref 30.0–36.0)
MCV: 95.1 fL (ref 80.0–100.0)
Monocytes Absolute: 0.5 10*3/uL (ref 0.1–1.0)
Monocytes Relative: 7 %
Neutro Abs: 6.6 10*3/uL (ref 1.7–7.7)
Neutrophils Relative %: 84 %
Platelets: 172 10*3/uL (ref 150–400)
RBC: 4.32 MIL/uL (ref 4.22–5.81)
RDW: 12.4 % (ref 11.5–15.5)
WBC: 7.9 10*3/uL (ref 4.0–10.5)
nRBC: 0 % (ref 0.0–0.2)

## 2018-12-13 MED ORDER — OXYCODONE HCL 5 MG PO TABS: 5.0000 mg | ORAL_TABLET | Freq: Four times a day (QID) | ORAL | 0 refills | Status: DC | PRN

## 2018-12-13 MED ORDER — PANTOPRAZOLE SODIUM 40 MG PO TBEC: 40.0000 mg | DELAYED_RELEASE_TABLET | Freq: Every day | ORAL | 0 refills | Status: DC

## 2018-12-13 MED ORDER — ONDANSETRON 4 MG PO TBDP: 4.0000 mg | ORAL_TABLET | Freq: Four times a day (QID) | ORAL | 0 refills | Status: DC | PRN

## 2018-12-13 NOTE — Progress Notes (Signed)
Patient d/c to home via wheelchair. Instructions were reviewed and all questions were answered. No needs expressed.

## 2018-12-13 NOTE — Progress Notes (Signed)
Patient alert and oriented, Post op day 1.  Provided support and encouragement.  Encouraged pulmonary toilet, ambulation and small sips of liquids.  All questions answered.  Will continue to monitor. 

## 2018-12-13 NOTE — Discharge Summary (Signed)
Physician Discharge Summary  Patient ID: Seth Rogers MRN: [id number] DOB/AGE: [birth year] 58 y.o.  PCP: Kristopher Glee., MD  Admit date: 12/12/2018 Discharge date: 12/13/2018  Admission Diagnoses:  Morbid obesity  Discharge Diagnoses:  Same; questionable gastric polyp seen at pylorus-recommended 4 -6 month repeat endoscopy  Active Problems:   Morbid obesity ([place])   Essential hypertension   S/P laparoscopic sleeve gastrectomy   Surgery:  Laparoscopic sleeve gastrectomy with postop endoscopy by Dr. Lucia Gaskins.   Discharged Condition: improved  Hospital Course:   Had lap sleeve gastrectomy on Tuesday.  Begun on liquids and did well.  Ready for discharge on Wednesday.    Consults: none  Significant Diagnostic Studies: none    Discharge Exam: Blood pressure (!) 141/76, pulse (!) 40, temperature 97.9 F (36.6 C), temperature source Oral, resp. rate 18, height 5\' 11"  (1.803 m), weight (!) 149.6 kg, SpO2 99 %. Incisions OK  Disposition: Discharge disposition: 01-Home or Self Care       Discharge Instructions    Ambulate hourly while awake   Complete by: As directed    Call MD for:  difficulty breathing, headache or visual disturbances   Complete by: As directed    Call MD for:  persistant dizziness or light-headedness   Complete by: As directed    Call MD for:  persistant nausea and vomiting   Complete by: As directed    Call MD for:  redness, tenderness, or signs of infection (pain, swelling, redness, odor or green/yellow discharge around incision site)   Complete by: As directed    Call MD for:  severe uncontrolled pain   Complete by: As directed    Call MD for:  temperature >101 F   Complete by: As directed    Diet bariatric full liquid   Complete by: As directed    Incentive spirometry   Complete by: As directed    Perform hourly while awake     Allergies as of 12/13/2018      Reactions   Penicillins Rash      Medication List    TAKE these medications    acetaminophen 500 MG tablet Commonly known as: TYLENOL Take 1,000 mg by mouth every 6 (six) hours as needed (for pain.).   hydrochlorothiazide 25 MG tablet Commonly known as: HYDRODIURIL Take 25 mg by mouth daily. Notes to patient: Monitor Blood Pressure Daily and keep a log for primary care physician.  Monitor for symptoms of dehydration.  You may need to make changes to your medications with rapid weight loss.     nadolol 40 MG tablet Commonly known as: CORGARD Take 40 mg by mouth daily. Notes to patient: Monitor Blood Pressure Daily and keep a log for primary care physician.  You may need to make changes to your medications with rapid weight loss.     ondansetron 4 MG disintegrating tablet Commonly known as: ZOFRAN-ODT Take 1 tablet (4 mg total) by mouth every 6 (six) hours as needed for nausea or vomiting.   oxyCODONE 5 MG immediate release tablet Commonly known as: Oxy IR/ROXICODONE Take 1 tablet (5 mg total) by mouth every 6 (six) hours as needed for severe pain.   pantoprazole 40 MG tablet Commonly known as: PROTONIX Take 1 tablet (40 mg total) by mouth daily.   telmisartan 80 MG tablet Commonly known as: MICARDIS Take 80 mg by mouth daily. Notes to patient: Monitor Blood Pressure Daily and keep a log for primary care physician.  You may need to make  changes to your medications with rapid weight loss.        Follow-up Information    Luretha Murphy, MD. Go on 01/05/2019.   Specialty: General Surgery Why: at [address] information: [address] [place] [address] [phone]        Hedda Slade, PA-C. Go on 02/02/2019.   Specialty: General Surgery Why: at [address] information: [address] [place] [address] [phone]           Signed: Valarie Merino 12/13/2018, 12:02 PM

## 2018-12-13 NOTE — Progress Notes (Signed)
Patient alert and oriented, pain is controlled. Patient is tolerating fluids, advanced to protein shake today, patient is tolerating well.  Reviewed Gastric sleeve discharge instructions with patient and patient is able to articulate understanding.  Provided information on BELT program, Support Group and [facility] outpatient pharmacy. All questions answered, will continue to monitor.  Total fluid intake 720 Per dehydration protocol call back one week post op 

## 2018-12-13 NOTE — Progress Notes (Signed)
Nutrition Note  RD consulted for diet education, pt is s/p bariatric surgery. While RDs are working remotely, Science writer providing education.  If nutrition issues arise, please consult RD.   Clayton Bibles, MS, RD, LDN Inpatient Clinical Dietitian Pager: [phone] After Hours Pager: [phone]

## 2018-12-14 LAB — SURGICAL PATHOLOGY

## 2018-12-18 ENCOUNTER — Telehealth (HOSPITAL_COMMUNITY): Payer: Self-pay

## 2018-12-18 NOTE — Telephone Encounter (Signed)
Patient called to discuss post bariatric surgery follow up questions.  See below:   1.  Tell me about your pain and pain management?denies  2.  Let's talk about fluid intake.  How much total fluid are you taking in?64   3.  How much protein have you taken in the last 2 days?60  4.  Have you had nausea?  Tell me about when have experienced nausea and what you did to help?denies  5.  Has the frequency or color changed with your urine?no problems light in color  6.  Tell me what your incisions look like?no problems starting to itch  7.  Have you been passing gas? BM?yes had bm, no problems  8.  If a problem or question were to arise who would you call?  Do you know contact numbers for [facility], [facility], and [facility]?aware of how to contact all services  9.  How has the walking going?walking around  10.  How are your vitamins and calcium going?  How are you taking them?mvi and calcium started  No further questions

## 2018-12-25 ENCOUNTER — Other Ambulatory Visit: Payer: Self-pay

## 2018-12-25 ENCOUNTER — Encounter: Payer: Self-pay | Admitting: Dietician

## 2018-12-25 ENCOUNTER — Encounter: Payer: 59 | Attending: Surgery | Admitting: Dietician

## 2018-12-25 DIAGNOSIS — E669 Obesity, unspecified: Secondary | ICD-10-CM | POA: Insufficient documentation

## 2018-12-25 NOTE — Patient Instructions (Signed)
Watch out for added sugars and fats in condiments.  - Keep the grams of Total Fat and the grams of Sugar in the single digits per serving (9 grams or less)  - Try Ortencia Kick brand   [place] job and see you at your next visit!

## 2018-12-25 NOTE — Progress Notes (Signed)
2 Week Post-Operative Nutrition Visit Bariatric Nutrition Education  Start Time: 3:00pm   End Time: 3:30pm  Patient was seen 1-on-1 on 12/25/2018 for post-operative nutrition education at [facility]   Surgery date: 12/12/2018 Surgery type: Sleeve  Start weight at [facility]: 406.5 (date: 04/26/2018) Weight today: 335.7 lbs Weight change: -10 lbs (since surgery 2 weeks ago)   Body Composition Scale 12/25/2018  Weight (lbs) 335.7  BMI 46.8  Total Body Fat % -     Visceral Fat -  Fat-Free Mass % -     Total Body Water % -     Muscle-Mass (lbs) -  Body Fat Displacement ---         Torso  (lbs) -         Left Leg  (lbs) -         Right Leg  (lbs) -         Left Arm  (lbs) -         Right Arm  (lbs) -   States MD decreased his blood pressure medication d/t feeling lightheaded upon standing.  Taking chewable bariatric MVI and calcium daily. States he has done a lot of walking and is wondering when he can be more physically active. States he expected to lose more weight during these first 2 weeks post op than he has.   Nutrition includes protein shakes, sugar free pudding, sugar free jello, Protein2O, Gatorade Zero, water  Protein: 60-65 grams/day  Fluids: 70-80 oz/day   The following the learning objectives were met by the patient during this course:  Identifies Phase 3 (Soft, High Protein Foods) Dietary Goals and will begin from 2 weeks post-operatively to 2 months post-operatively  Identifies appropriate sources of fluids and proteins   States protein recommendations and appropriate sources post-operatively  Identifies the need for appropriate texture modifications, mastication, and bite sizes when consuming solids  Identifies appropriate multivitamin and calcium sources post-operatively  Describes the need for physical activity post-operatively and will follow MD recommendations  States when to call healthcare provider regarding medication  questions or post-operative complications  Handouts given include:  Phase 3: Soft, High Protein Diet  Phase 3 Meal Ideas  Follow-Up Plan: Patient will follow-up at [facility] in 6 weeks for 2 month post-op nutrition visit for diet advancement per MD.

## 2019-01-01 ENCOUNTER — Telehealth: Payer: Self-pay | Admitting: Dietician

## 2019-01-01 NOTE — Telephone Encounter (Signed)
I spoke with patient via telephone to assess fluid intake and food tolerance since diet advancement to solid protein foods on 12/25/2018.  Surgery Date: 12/12/2018 Surgery Type: Sleeve   Daily fluid intake: 60+ ounces Daily protein intake: 70-80 grams  Patient states he felt uncomfortable after eating shrimp too quickly, otherwise no issues. Foods eaten include shrimp, hamburger patty, egg, chicken, pork BBQ. Fluids include protein shakes, Gatorade Zero, and water. Walking at least 1 hour per day. Asked about tips to help with metallic taste in mouth. States he is still working on sipping throughout the day, used to be a "drinker" and likes water but is used to drinking a lot at one time. Discussed brands for condiments (such as Ortencia Kick) and electrolytes without artificial sweeteners (such as Nuun.)   Plan to follow up on 01/31/2019 for diet advancement.    Nat Christen [place]) Short, MS, RD, LDN

## 2019-01-31 ENCOUNTER — Encounter: Payer: 59 | Attending: Surgery | Admitting: Dietician

## 2019-01-31 ENCOUNTER — Other Ambulatory Visit: Payer: Self-pay

## 2019-01-31 ENCOUNTER — Encounter: Payer: Self-pay | Admitting: Dietician

## 2019-01-31 DIAGNOSIS — E669 Obesity, unspecified: Secondary | ICD-10-CM | POA: Diagnosis present

## 2019-01-31 DIAGNOSIS — Z9884 Bariatric surgery status: Secondary | ICD-10-CM

## 2019-01-31 NOTE — Patient Instructions (Addendum)
Try using plain non-fat [place] yogurt as a substitute for sour cream  Goals:  . Continue to aim for a minimum of 64 fluid ounces daily with at least 32 ounces being plain water . Eat non-starchy vegetables 2 times a day 7 days a week . Start out with soft cooked vegetables today and tomorrow; if tolerated, begin to eat raw vegetables including salads . Per meal/snack, eat 3 ounces of protein first then non-starchy vegetables o Once you understand how much of your meal leads to satisfaction (not full) while still eating 3 ounces of protein and non-starchy vegetables, you can eat them in any order (figure out how much you can eat at a time to get enough but not too much)  . Continue to aim for 30 minutes of physical activity at least 5 times a week

## 2019-01-31 NOTE — Progress Notes (Addendum)
Bariatric Nutrition Follow-Up Visit Medical Nutrition Therapy  Appt Start Time: 3:15pm   End Time: 3:45pm  2 Months Post-Operative Sleeve Surgery Surgery Date: 12/12/2018  Pt's Expectations of Surgery/ Goals: to lose weight    NUTRITION ASSESSMENT  Anthropometrics  Start weight at [facility]: 406.5 lbs (date: 04/26/2018) Today's weight: pt declined  Body Composition Scale 12/25/2018 01/31/2019  Weight  lbs 335.7 pt declined  BMI 46.8   Total Body Fat  % -      Visceral Fat -   Fat-Free Mass  % -      Total Body Water  % -      Muscle-Mass  lbs -   Body Fat Displacement --- ---         Torso  lbs -          Left Leg  lbs -          Right Leg  lbs -          Left Arm  lbs -          Right Arm  lbs -     Lifestyle & Dietary Hx Patient lives with his wife and their dog, who he walks with daily.  Overall reports no problems or concerns other than occasional constipation. States he now recognizes fullness and can typically fit no more than 3-4 ounces at one time. Eating only protein foods, primarily chicken, eggs, pork BBQ, brisket. Does not eat breakfast, eats 2x/day.  States he has found he now likes milk. Will have sugar-free chocolate pudding his wife fixes for him. Drinks lots of water daily (5-6 bottles) but thinks he should be drinking more (used to drinking a lot prior to surgery.)    Estimated daily fluid intake: 85+ oz Estimated daily protein intake: 65 g Supplements: bariatric MVI capsule, chewable calcium 3x/day Current average weekly physical activity: walking (1.5 miles/weekday or 3-4 miles/weekend day)    24-Hr Dietary Recall First Meal: - Snack: -  Second Meal: 3-4 ounces chicken  Snack: -  Third Meal: 3-4 ounces brisket  Snack: sugar-free pudding  Beverages: water    Post-Op Goals/ Signs/ Symptoms Using straws: no Drinking while eating: no Chewing/swallowing difficulties: no Changes in vision: no Changes to mood/headaches: no Hair loss/changes to skin/nails:  no Difficulty focusing/concentrating: no Sweating: no Dizziness/lightheadedness: no Palpitations: no  Carbonated/caffeinated beverages: no N/V/D/C/Gas: constipation  Abdominal pain: no Dumping syndrome: no   NUTRITION DIAGNOSIS  Overweight/obesity ([place]-3.3) related to past poor dietary habits and physical inactivity as evidenced by completed bariatric surgery and following dietary guidelines for continued weight loss and healthy nutrition status.   NUTRITION INTERVENTION Nutrition counseling (C-1) and education (E-2) to facilitate bariatric surgery goals, including: . Diet advancement to the next phase (phase 4) now including non-starchy vegetables . The importance of consuming adequate calories as well as certain nutrients daily due to the body's need for essential vitamins, minerals, and fats . The importance of daily physical activity and to reach a goal of at least 150 minutes of moderate to vigorous physical activity weekly (or as directed by their physician) due to benefits such as increased musculature and improved lab values  Handouts Provided Include   Phase 4: Protein + Non-Starchy Vegetables   Bariatric High-Protein Snack Ideas  Learning Style & Readiness for Change Teaching method utilized: Visual & Auditory  Demonstrated degree of understanding via: Teach Back  Barriers to learning/adherence to lifestyle change: None Identified    MONITORING & EVALUATION Dietary intake, weekly  physical activity, body weight, and goals in 4 months.  Next Steps Patient is to follow-up in 4 months for 6 month post-op follow-up.

## 2019-05-22 ENCOUNTER — Ambulatory Visit: Payer: 59 | Admitting: Skilled Nursing Facility1

## 2019-07-24 ENCOUNTER — Other Ambulatory Visit: Payer: Self-pay

## 2019-07-24 ENCOUNTER — Encounter: Payer: 59 | Attending: Surgery | Admitting: Skilled Nursing Facility1

## 2019-07-30 NOTE — Progress Notes (Signed)
Follow-up visit:  Post-Operative sleeve Surgery  Medical Nutrition Therapy:  Appt start time: 6:00pm end time:  7:00pm  Primary concerns today: Post-operative Bariatric Surgery Nutrition Management 6 Month Post-Op Class   12/12/2018 Wt today: 239.5   Body Composition Scale Declined  Weight  lbs   Total Body Fat  %      Visceral Fat   Fat-Free Mass  %      Total Body Water  %      Muscle-Mass  lbs   BMI   Body Fat Displacement ---        Torso  lbs         Left Leg  lbs         Right Leg  lbs         Left Arm  lbs         Right Arm  lbs      Information Reviewed/ Discussed During Appointment: -Review of composition scale numbers -Fluid requirements (64-100 ounces) -Protein requirements (60-80g) -Strategies for tolerating diet -Advancement of diet to include Starchy vegetables -Barriers to inclusion of new foods -Inclusion of appropriate multivitamin and calcium supplements  -Exercise recommendations   Fluid intake: adequate   Medications: See List Supplementation: appropriate    Using straws: no Drinking while eating: no Having you been chewing well: yes Chewing/swallowing difficulties: no Changes in vision: no Changes to mood/headaches: no Hair loss/Cahnges to skin/Changes to nails: no Any difficulty focusing or concentrating: no Sweating: no Dizziness/Lightheaded: no Palpitations: no  Carbonated beverages: no N/V/D/C/GAS: no Abdominal Pain: no Dumping syndrome: no  Recent physical activity:  ADL's  Progress Towards Goal(s):  In Progress  Handouts given during visit include:  Phase V diet Progression   Goals Sheet  The Benefits of Exercise are endless.....  Support Group Topics  Pt Chosen Goals:  Functional Goals:  I will eat in the/at the _____table_________ every time I eat a meal by (specific date) ___07/01/21____________  Physical Activity Goals: I will (type of movement) _______wt lift__________________ for (hours/minutes)  ___30_______, for (how many days a week) _________________3_________ by (specific date) _____07/01/21___________ I will take the stairs whenever available by (specific date) ___________________06/01/21___________  Teaching Method Utilized:  Visual Auditory Hands on  Demonstrated degree of understanding via:  Teach Back   Monitoring/Evaluation:  Dietary intake, exercise, and body weight. Follow up in 3 months for 9 month post-op visit.

## 2019-10-23 ENCOUNTER — Ambulatory Visit: Payer: 59 | Admitting: Dietician

## 2019-10-30 ENCOUNTER — Encounter: Payer: 59 | Attending: Surgery | Admitting: Skilled Nursing Facility1

## 2019-10-30 ENCOUNTER — Other Ambulatory Visit: Payer: Self-pay

## 2019-10-30 NOTE — Progress Notes (Signed)
Follow-up visit:  Post-Operative sleeve Surgery   Primary concerns today: Post-operative Bariatric Surgery Nutrition Management   Sleeve 12/12/2018 Wt today: pt declined   Pt states he feels great. Pt states he limits his fried foods to once a month. Pt states he grabs a handful of peanuts for a sweet craving recognizing he cannot eat as much as he might want too. Pt states he got a stomach ache from his multi having taken with food: he is taking them first thing in the morning. Pt states sometimes he takes calcium 6 times using them as a snack. Pt states his blood pressure stays elevated.   Goals: For 2 weeks check your blood pressure daily while NOT eating out at all (including soups and chinese food) to determine if the salt from eating out is having an impact on the increase of your blood pressure  Change taking your multivitamin to in the evening   24 hr recall:  Eggs + sausage or bacon  Mexican: ACP Hamburger patty + onions + salad + cheese   Fluid intake: 101 oz plain water   Medications: See List Supplementation: appropriate    Using straws: no Drinking while eating: no Having you been chewing well: yes Chewing/swallowing difficulties: no Changes in vision: no Changes to mood/headaches: no Hair loss/Cahnges to skin/Changes to nails: no Any difficulty focusing or concentrating: no Sweating: no Dizziness/Lightheaded: no Palpitations: no  Carbonated beverages: no N/V/D/C/GAS: no Abdominal Pain: no Dumping syndrome: no  Recent physical activity:  ADL's  Progress Towards Goal(s):  In Progress  Teaching Method Utilized:  Visual Auditory Hands on  Demonstrated degree of understanding via:  Teach Back   Monitoring/Evaluation:  Dietary intake, exercise, and body weight. Follow up in 3 months for 9 month post-op visit.

## 2019-12-25 ENCOUNTER — Telehealth: Payer: Self-pay | Admitting: Skilled Nursing Facility1

## 2019-12-25 NOTE — Telephone Encounter (Signed)
Usually that means you would want to take it on a full stomach. If you think it is the iron you can take CelebrateOne 18 which has less iron. Let me know how it goes.  From: Desiree Lucy [email or web address]>  Sent: Tuesday, December 25, 2019 2:28 PM To: Seth Rogers [email or web address]> Cc: Seth Rogers [email or web address]> Subject: Multi Vitamin  *Caution - External email - see footer for warningsJon Rogers,  Can you help me out with a Multi Vitamin?  I have been trying to take the [facility] Bariatric Multivitamins with 45 MG Iron.  I stopped taking them because they upset my stomach.  I just had a physical and my doctor said my Iron was not bad, but questionable.  I did try to take one again last night right before going to bed thinking I would go on to sleep and it would not bother me.  It kept me awake until almost 3 oclock this morning.  My stomach was very upset and I started sweating.  My hair was completely wet.  Finally I got sick on my stomach and once I puked, I felt better.

## 2020-01-01 ENCOUNTER — Ambulatory Visit: Payer: 59 | Admitting: Skilled Nursing Facility1

## 2020-01-02 ENCOUNTER — Encounter: Payer: 59 | Admitting: Skilled Nursing Facility1

## 2020-01-03 ENCOUNTER — Other Ambulatory Visit: Payer: Self-pay

## 2020-01-03 ENCOUNTER — Encounter: Payer: 59 | Attending: Surgery | Admitting: Skilled Nursing Facility1

## 2020-01-03 NOTE — Progress Notes (Signed)
Follow-up visit:  Post-Operative sleeve Surgery   Primary concerns today: Post-operative Bariatric Surgery Nutrition Management   Sleeve 12/12/2018 Wt today: 230 pounds  Pt states sometimes he takes calcium 6 times using them as a snack.   Pt states he was a lower weight but due to taking care of his wife and having been on vacation has gained a few pounds.  Pt states he switched to a lower iron level in his multi which is No longer upsetting his stomach. Pt states he has added a blood pressure pill. Pt states he is very happy with his success.   24 hr recall:  Breakfast: fast food sausage and eggs Lunch: ribye steak sandwich + vegetables Dinner: chicken + broccoli + onion + green beans Snack: ice cream  Fluid intake: 101 oz plain water   Medications: See List Supplementation: appropriate    Using straws: no Drinking while eating: no Having you been chewing well: yes Chewing/swallowing difficulties: no Changes in vision: no Changes to mood/headaches: no Hair loss/Cahnges to skin/Changes to nails: no Any difficulty focusing or concentrating: no Sweating: no Dizziness/Lightheaded: no Palpitations: no  Carbonated beverages: no N/V/D/C/GAS: having bowel movement every 2 days Abdominal Pain: no Dumping syndrome: no  Recent physical activity:  Walking 7 days a Week 10000+ steps  Progress Towards Goal(s):  In Progress  Handouts Given: 12 month handout folder  Goals: Aim to add resistance workouts to your week either yoga or light weights Ensure you are eating lean protein and non starchy vegetables along with your carbohydrate choice Do not continue vacation habits such as increased sweet eating  Teaching Method Utilized:  Visual Auditory Hands on  Demonstrated degree of understanding via:  Teach Back   Monitoring/Evaluation:  Dietary intake, exercise, and body weight.

## 2020-01-18 ENCOUNTER — Encounter (HOSPITAL_COMMUNITY): Payer: Self-pay | Admitting: Ophthalmology

## 2020-01-18 ENCOUNTER — Other Ambulatory Visit: Payer: Self-pay

## 2020-01-18 NOTE — Progress Notes (Signed)
Patient was given pre-op instructions over the phone. The opportunity was given for the patient to ask questions. No further questions asked. Patient verbalized understanding of instructions given.   Nothing to eat or drink after midnight.  7 days prior to surgery STOP taking any Aspirin (unless otherwise instructed by your surgeon), Aleve, Naproxen, Ibuprofen, Motrin, Advil, Goody's, BC's, all herbal medications, fish oil, and all vitamins.   Anesthesia Review Needed: n/a  

## 2020-01-19 ENCOUNTER — Encounter (HOSPITAL_COMMUNITY): Payer: Self-pay | Admitting: Ophthalmology

## 2020-01-19 ENCOUNTER — Inpatient Hospital Stay (HOSPITAL_COMMUNITY): Payer: 59 | Admitting: Anesthesiology

## 2020-01-19 ENCOUNTER — Encounter (HOSPITAL_COMMUNITY)
Admission: RE | Disposition: A | Discharge status: Home / Self Care | Payer: Self-pay | Source: Home / Self Care | Attending: Ophthalmology

## 2020-01-19 ENCOUNTER — Ambulatory Visit (HOSPITAL_COMMUNITY)
Admission: RE | Admit: 2020-01-19 | Discharge: 2020-01-19 | Disposition: A | Discharge status: Home / Self Care | Payer: 59 | Attending: Ophthalmology | Admitting: Ophthalmology

## 2020-01-19 DIAGNOSIS — H33021 Retinal detachment with multiple breaks, right eye: Secondary | ICD-10-CM | POA: Insufficient documentation

## 2020-01-19 DIAGNOSIS — Z20822 Contact with and (suspected) exposure to covid-19: Secondary | ICD-10-CM | POA: Diagnosis not present

## 2020-01-19 HISTORY — PX: REPAIR OF COMPLEX TRACTION RETINAL DETACHMENT: SHX6217

## 2020-01-19 LAB — BASIC METABOLIC PANEL
Anion gap: 9 (ref 5–15)
BUN: 14 mg/dL (ref 6–20)
CO2: 25 mmol/L (ref 22–32)
Calcium: 9.1 mg/dL (ref 8.9–10.3)
Chloride: 106 mmol/L (ref 98–111)
Creatinine, Ser: 0.85 mg/dL (ref 0.61–1.24)
GFR, Estimated: >60 mL/min (ref >60)
Glucose, Bld: 89 mg/dL (ref 70–99)
Potassium: 3.8 mmol/L (ref 3.5–5.1)
Sodium: 140 mmol/L (ref 135–145)

## 2020-01-19 LAB — SARS CORONAVIRUS 2 BY RT PCR (HOSPITAL ORDER, PERFORMED IN ~~LOC~~ HOSPITAL LAB): SARS Coronavirus 2: NEGATIVE

## 2020-01-19 SURGERY — REPAIR, RETINAL DETACHMENT, COMPLEX
Anesthesia: Monitor Anesthesia Care | Laterality: Right

## 2020-01-19 MED ORDER — EPINEPHRINE PF 1 MG/ML IJ SOLN: INTRAOCULAR | Status: DC | PRN

## 2020-01-19 MED ORDER — LIDOCAINE HCL 2 % IJ SOLN
INTRAMUSCULAR | Status: AC
  Filled 2020-01-19: qty 20

## 2020-01-19 MED ORDER — PROPOFOL 10 MG/ML IV BOLUS
INTRAVENOUS | Status: DC | PRN
  Administered 2020-01-19: 40 mg via INTRAVENOUS
  Administered 2020-01-19: 20 mg via INTRAVENOUS

## 2020-01-19 MED ORDER — TOBRAMYCIN-DEXAMETHASONE 0.3-0.1 % OP OINT
TOPICAL_OINTMENT | OPHTHALMIC | Status: DC | PRN
  Administered 2020-01-19: 1 via OPHTHALMIC

## 2020-01-19 MED ORDER — TOBRAMYCIN-DEXAMETHASONE 0.3-0.1 % OP OINT
TOPICAL_OINTMENT | OPHTHALMIC | Status: AC
  Filled 2020-01-19: qty 3.5

## 2020-01-19 MED ORDER — PROMETHAZINE HCL 25 MG/ML IJ SOLN: 6.2500 mg | INTRAMUSCULAR | Status: DC | PRN

## 2020-01-19 MED ORDER — 0.9 % SODIUM CHLORIDE (POUR BTL) OPTIME
TOPICAL | Status: DC | PRN
  Administered 2020-01-19: 1000 mL

## 2020-01-19 MED ORDER — TROPICAMIDE 1 % OP SOLN
1.0000 [drp] | OPHTHALMIC | Status: AC | PRN
  Administered 2020-01-19 (×3): 1 [drp] via OPHTHALMIC
  Filled 2020-01-19: qty 15

## 2020-01-19 MED ORDER — FENTANYL CITRATE (PF) 250 MCG/5ML IJ SOLN
INTRAMUSCULAR | Status: DC | PRN
  Administered 2020-01-19: 50 ug via INTRAVENOUS

## 2020-01-19 MED ORDER — OXYCODONE HCL 5 MG PO TABS: 5.0000 mg | ORAL_TABLET | Freq: Once | ORAL | Status: DC | PRN

## 2020-01-19 MED ORDER — CHLORHEXIDINE GLUCONATE 0.12 % MT SOLN: 15.0000 mL | Freq: Once | OROMUCOSAL | Status: AC

## 2020-01-19 MED ORDER — HYPROMELLOSE (GONIOSCOPIC) 2.5 % OP SOLN
OPHTHALMIC | Status: DC | PRN
  Administered 2020-01-19: 2 [drp] via OPHTHALMIC

## 2020-01-19 MED ORDER — BUPIVACAINE HCL (PF) 0.75 % IJ SOLN
INTRAMUSCULAR | Status: AC
  Filled 2020-01-19: qty 10

## 2020-01-19 MED ORDER — ACETAMINOPHEN 500 MG PO TABS
1000.0000 mg | ORAL_TABLET | Freq: Once | ORAL | Status: AC
  Administered 2020-01-19: 1000 mg via ORAL
  Filled 2020-01-19: qty 2

## 2020-01-19 MED ORDER — OFLOXACIN 0.3 % OP SOLN
1.0000 [drp] | OPHTHALMIC | Status: AC | PRN
  Administered 2020-01-19: 1 [drp] via OPHTHALMIC

## 2020-01-19 MED ORDER — BSS IO SOLN
INTRAOCULAR | Status: AC
  Filled 2020-01-19: qty 15

## 2020-01-19 MED ORDER — LIDOCAINE 2% (20 MG/ML) 5 ML SYRINGE
INTRAMUSCULAR | Status: DC | PRN
  Administered 2020-01-19: 20 mg via INTRAVENOUS

## 2020-01-19 MED ORDER — DEXAMETHASONE SODIUM PHOSPHATE 10 MG/ML IJ SOLN
INTRAMUSCULAR | Status: AC
  Filled 2020-01-19: qty 1

## 2020-01-19 MED ORDER — OXYCODONE HCL 5 MG/5ML PO SOLN: 5.0000 mg | Freq: Once | ORAL | Status: DC | PRN

## 2020-01-19 MED ORDER — HYPROMELLOSE (GONIOSCOPIC) 2.5 % OP SOLN
OPHTHALMIC | Status: AC
  Filled 2020-01-19: qty 15

## 2020-01-19 MED ORDER — ONDANSETRON HCL 4 MG/2ML IJ SOLN
INTRAMUSCULAR | Status: DC | PRN
  Administered 2020-01-19: 4 mg via INTRAVENOUS

## 2020-01-19 MED ORDER — OFLOXACIN 0.3 % OP SOLN
1.0000 [drp] | OPHTHALMIC | Status: AC | PRN
  Administered 2020-01-19 (×2): 1 [drp] via OPHTHALMIC
  Filled 2020-01-19: qty 5

## 2020-01-19 MED ORDER — CHLORHEXIDINE GLUCONATE 0.12 % MT SOLN
OROMUCOSAL | Status: AC
  Administered 2020-01-19: 15 mL via OROMUCOSAL
  Filled 2020-01-19: qty 15

## 2020-01-19 MED ORDER — MIDAZOLAM HCL 2 MG/2ML IJ SOLN
INTRAMUSCULAR | Status: AC
  Filled 2020-01-19: qty 2

## 2020-01-19 MED ORDER — LACTATED RINGERS IV SOLN: INTRAVENOUS | Status: DC | PRN

## 2020-01-19 MED ORDER — PROPOFOL 10 MG/ML IV BOLUS
INTRAVENOUS | Status: AC
  Filled 2020-01-19: qty 40

## 2020-01-19 MED ORDER — VANCOMYCIN SUBCONJUNCTIVAL INJECTION 25 MG/0.5 ML
25.0000 mg | INTRAOCULAR | Status: DC
  Filled 2020-01-19: qty 0.5

## 2020-01-19 MED ORDER — FENTANYL CITRATE (PF) 250 MCG/5ML IJ SOLN
INTRAMUSCULAR | Status: AC
  Filled 2020-01-19: qty 5

## 2020-01-19 MED ORDER — KETOROLAC TROMETHAMINE 30 MG/ML IJ SOLN: 30.0000 mg | Freq: Once | INTRAMUSCULAR | Status: DC | PRN

## 2020-01-19 MED ORDER — EPINEPHRINE PF 1 MG/ML IJ SOLN
INTRAMUSCULAR | Status: AC
  Filled 2020-01-19: qty 1

## 2020-01-19 MED ORDER — BSS PLUS IO SOLN
INTRAOCULAR | Status: AC
  Filled 2020-01-19: qty 500

## 2020-01-19 MED ORDER — SODIUM CHLORIDE 0.9 % IV SOLN: INTRAVENOUS | Status: DC

## 2020-01-19 MED ORDER — HYALURONIDASE HUMAN 150 UNIT/ML IJ SOLN
INTRAMUSCULAR | Status: AC
  Filled 2020-01-19: qty 1

## 2020-01-19 MED ORDER — MIDAZOLAM HCL 5 MG/5ML IJ SOLN
INTRAMUSCULAR | Status: DC | PRN
  Administered 2020-01-19 (×2): 1 mg via INTRAVENOUS

## 2020-01-19 MED ORDER — PROPARACAINE HCL 0.5 % OP SOLN
1.0000 [drp] | OPHTHALMIC | Status: AC | PRN
  Administered 2020-01-19 (×3): 1 [drp] via OPHTHALMIC
  Filled 2020-01-19: qty 15

## 2020-01-19 MED ORDER — PHENYLEPHRINE HCL 2.5 % OP SOLN
1.0000 [drp] | OPHTHALMIC | Status: AC | PRN
  Administered 2020-01-19 (×3): 1 [drp] via OPHTHALMIC
  Filled 2020-01-19: qty 2

## 2020-01-19 MED ORDER — CYCLOPENTOLATE HCL 1 % OP SOLN
1.0000 [drp] | OPHTHALMIC | Status: AC | PRN
  Administered 2020-01-19 (×3): 1 [drp] via OPHTHALMIC
  Filled 2020-01-19: qty 2

## 2020-01-19 MED ORDER — VANCOMYCIN SUBCONJUNCTIVAL INJECTION 25 MG/0.5 ML
INTRAOCULAR | Status: DC | PRN
  Administered 2020-01-19: 25 mg via SUBCONJUNCTIVAL

## 2020-01-19 MED ORDER — LIDOCAINE HCL 2 % IJ SOLN
INTRAMUSCULAR | Status: DC | PRN
  Administered 2020-01-19: 7 mL via RETROBULBAR

## 2020-01-19 MED ORDER — FENTANYL CITRATE (PF) 100 MCG/2ML IJ SOLN: 25.0000 ug | INTRAMUSCULAR | Status: DC | PRN

## 2020-01-19 MED ORDER — ORAL CARE MOUTH RINSE: 15.0000 mL | Freq: Once | OROMUCOSAL | Status: AC

## 2020-01-19 SURGICAL SUPPLY — 68 items
APPLICATOR COTTON TIP 6 STRL (MISCELLANEOUS) ×1 IMPLANT
APPLICATOR COTTON TIP 6IN STRL (MISCELLANEOUS) ×4 IMPLANT
BAND WRIST GAS GREEN (MISCELLANEOUS) IMPLANT
BLADE MVR KNIFE 20G (BLADE) IMPLANT
BLADE STAB KNIFE 15DEG (BLADE) IMPLANT
BNDG EYE OVAL (GAUZE/BANDAGES/DRESSINGS) ×1 IMPLANT
CABLE BIPOLOR RESECTION CORD (MISCELLANEOUS) ×1 IMPLANT
CANNULA ANT CHAM MAIN (OPHTHALMIC RELATED) IMPLANT
CANNULA DUAL BORE 23G (CANNULA) IMPLANT
CANNULA DUALBORE 25G (CANNULA) IMPLANT
CANNULA VLV SOFT TIP 25G (OPHTHALMIC) IMPLANT
CANNULA VLV SOFT TIP 25GA (OPHTHALMIC) ×2 IMPLANT
CAUTERY EYE LOW TEMP 1300F FIN (OPHTHALMIC RELATED) IMPLANT
CLSR STERI-STRIP ANTIMIC 1/2X4 (GAUZE/BANDAGES/DRESSINGS) ×1 IMPLANT
COVER MAYO STAND STRL (DRAPES) IMPLANT
DRAPE HALF SHEET 40X57 (DRAPES) ×1 IMPLANT
DRAPE INCISE 51X51 W/FILM STRL (DRAPES) IMPLANT
DRAPE RETRACTOR (MISCELLANEOUS) ×2 IMPLANT
ERASER HMR WETFIELD 23G BP (MISCELLANEOUS) IMPLANT
FORCEPS ECKARDT ILM 25G SERR (OPHTHALMIC RELATED) IMPLANT
FORCEPS GRIESHABER ILM 25G A (INSTRUMENTS) IMPLANT
GAS AUTO FILL CONSTEL (OPHTHALMIC) ×2
GAS AUTO FILL CONSTELLATION (OPHTHALMIC) IMPLANT
GAS WRIST BAND GREEN (MISCELLANEOUS) ×1
GLOVE SURG SYN 7.5  E (GLOVE) ×1
GLOVE SURG SYN 7.5 E (GLOVE) ×1 IMPLANT
GLOVE SURG SYN 7.5 PF PI (GLOVE) ×1 IMPLANT
GOWN STRL REUS W/ TWL LRG LVL3 (GOWN DISPOSABLE) ×1 IMPLANT
GOWN STRL REUS W/TWL LRG LVL3 (GOWN DISPOSABLE) ×1
KIT BASIN OR (CUSTOM PROCEDURE TRAY) ×2 IMPLANT
KIT TURNOVER KIT B (KITS) ×2 IMPLANT
LENS BIOM SUPER VIEW SET DISP (MISCELLANEOUS) ×1 IMPLANT
MICROPICK 25G (MISCELLANEOUS)
NDL 18GX1X1/2 (RX/OR ONLY) (NEEDLE) ×1 IMPLANT
NDL 25GX 5/8IN NON SAFETY (NEEDLE) ×1 IMPLANT
NDL FILTER BLUNT 18X1 1/2 (NEEDLE) ×1 IMPLANT
NDL HYPO 25GX1X1/2 BEV (NEEDLE) IMPLANT
NDL HYPO 30X.5 LL (NEEDLE) ×2 IMPLANT
NDL RETROBULBAR 25GX1.5 (NEEDLE) ×1 IMPLANT
NEEDLE 18GX1X1/2 (RX/OR ONLY) (NEEDLE) ×2 IMPLANT
NEEDLE 25GX 5/8IN NON SAFETY (NEEDLE) IMPLANT
NEEDLE FILTER BLUNT 18X 1/2SAF (NEEDLE) ×1
NEEDLE FILTER BLUNT 18X1 1/2 (NEEDLE) ×1 IMPLANT
NEEDLE HYPO 25GX1X1/2 BEV (NEEDLE) ×2 IMPLANT
NEEDLE HYPO 30X.5 LL (NEEDLE) ×4 IMPLANT
NEEDLE RETROBULBAR 25GX1.5 (NEEDLE) ×4 IMPLANT
NS IRRIG 1000ML POUR BTL (IV SOLUTION) ×2 IMPLANT
PACK FRAGMATOME (OPHTHALMIC) IMPLANT
PACK VITRECTOMY CUSTOM (CUSTOM PROCEDURE TRAY) ×2 IMPLANT
PAD ARMBOARD 7.5X6 YLW CONV (MISCELLANEOUS) ×4 IMPLANT
PAK PIK VITRECTOMY CVS 25GA (OPHTHALMIC) ×2 IMPLANT
PICK MICROPICK 25G (MISCELLANEOUS) IMPLANT
PROBE ENDO DIATHERMY 25G (MISCELLANEOUS) IMPLANT
PROBE LASER ILLUM FLEX CVD 25G (OPHTHALMIC) ×1 IMPLANT
ROLLS DENTAL (MISCELLANEOUS) IMPLANT
SCRAPER DIAMOND 25GA (OPHTHALMIC RELATED) IMPLANT
SHIELD EYE LENSE ONLY DISP (GAUZE/BANDAGES/DRESSINGS) ×1 IMPLANT
SOL ANTI FOG 6CC (MISCELLANEOUS) ×1 IMPLANT
SOLUTION ANTI FOG 6CC (MISCELLANEOUS) ×1
STOPCOCK 4 WAY LG BORE MALE ST (IV SETS) IMPLANT
SUT VICRYL 7 0 TG140 8 (SUTURE) ×1 IMPLANT
SUT VICRYL 8 0 TG140 8 (SUTURE) IMPLANT
SYR 10ML LL (SYRINGE) ×2 IMPLANT
SYR 20ML LL LF (SYRINGE) ×2 IMPLANT
SYR 5ML LL (SYRINGE) ×2 IMPLANT
SYR TB 1ML LUER SLIP (SYRINGE) ×1 IMPLANT
WATER STERILE IRR 1000ML POUR (IV SOLUTION) ×2 IMPLANT
WIPE INSTRUMENT VISIWIPE 73X73 (MISCELLANEOUS) ×1 IMPLANT

## 2020-01-19 NOTE — Transfer of Care (Signed)
Immediate Anesthesia Transfer of Care Note  Patient: Seth Rogers  Procedure(s) Performed: REPAIR OF COMPLEX TRACTION RETINAL DETACHMENT (Right )  Patient Location: PACU  Anesthesia Type:General  Level of Consciousness: awake, alert  and oriented  Airway & Oxygen Therapy: Patient Spontanous Breathing and Patient connected to nasal cannula oxygen  Post-op Assessment: Report given to RN and Post -op Vital signs reviewed and stable  Post vital signs: Reviewed and stable  Last Vitals:  Vitals Value Taken Time  BP 150/68 01/19/20 0937  Temp    Pulse 43 01/19/20 0939  Resp 16 01/19/20 0939  SpO2 100 % 01/19/20 0939  Vitals shown include unvalidated device data.  Last Pain:  Vitals:   01/19/20 0707  TempSrc: Oral  PainSc: 0-No pain      Patients Stated Pain Goal: 3 (01/19/20 0707)  Complications: No complications documented.

## 2020-01-19 NOTE — Brief Op Note (Signed)
01/19/2020  9:31 AM  PATIENT:  Seth Rogers  59 y.o. 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   Delay start of Pharmacological VTE agent (>24hrs) due to surgical blood loss or risk of bleeding: not applicable

## 2020-01-19 NOTE — Anesthesia Procedure Notes (Signed)
Procedure Name: MAC Date/Time: 01/19/2020 8:00 AM Performed by: Wilburn Cornelia, CRNA Pre-anesthesia Checklist: Patient identified, Emergency Drugs available, Suction available, Patient being monitored and Timeout performed Patient Re-evaluated:Patient Re-evaluated prior to induction Oxygen Delivery Method: Nasal cannula Placement Confirmation: positive ETCO2 and breath sounds checked- equal and bilateral Dental Injury: Teeth and Oropharynx as per pre-operative assessment

## 2020-01-19 NOTE — Anesthesia Preprocedure Evaluation (Addendum)
Anesthesia Evaluation  Patient identified by MRN, date of birth, ID band Patient awake    Reviewed: Allergy & Precautions, NPO status , Patient's Chart, lab work & pertinent test results  Airway Mallampati: II  TM Distance: >3 FB Neck ROM: Full    Dental  (+) Missing   Pulmonary neg pulmonary ROS,    Pulmonary exam normal breath sounds clear to auscultation       Cardiovascular hypertension, Pt. on medications Normal cardiovascular exam Rhythm:Regular Rate:Normal     Neuro/Psych negative neurological ROS  negative psych ROS   GI/Hepatic negative GI ROS, Neg liver ROS,   Endo/Other  negative endocrine ROS  Renal/GU negative Renal ROS     Musculoskeletal negative musculoskeletal ROS (+)   Abdominal   Peds  Hematology HLD   Anesthesia Other Findings RETINAL DETACHMENT RIGHT EYE  Reproductive/Obstetrics                            Anesthesia Physical Anesthesia Plan  ASA: II  Anesthesia Plan: MAC   Post-op Pain Management:    Induction: Intravenous  PONV Risk Score and Plan: 1 and Ondansetron, Dexamethasone, Midazolam and Treatment may vary due to age or medical condition  Airway Management Planned: Nasal Cannula  Additional Equipment:   Intra-op Plan:   Post-operative Plan:   Informed Consent: I have reviewed the patients History and Physical, chart, labs and discussed the procedure including the risks, benefits and alternatives for the proposed anesthesia with the patient or authorized representative who has indicated his/her understanding and acceptance.     Dental advisory given  Plan Discussed with: CRNA  Anesthesia Plan Comments:         Anesthesia Quick Evaluation

## 2020-01-19 NOTE — Op Note (Signed)
Seth Rogers 01/19/2020 Diagnosis: Retinal detachment with multiple breaks right eye  Procedure: [place] Vitrectomy, Endolaser, Fluid Gas Exchange and drainage of subretinal fluid with SF6 gas tamponade Operative Eye:  right eye  Surgeon: Harrold Donath Estimated Blood Loss: minimal Specimens for Pathology:  None Complications: none   The  patient was prepped and draped in the usual fashion for ocular surgery on the right eye .  A lid speculum was placed.  Infusion line and trocar was placed at the 8 o'clock position approximately 3.5 mm from the surgical limbus.   The infusion line was allowed to run and then clamped when placed at the cannula opening. The line was inserted and secured to the drape with an adhesive strip.   Active trocars/cannula were placed at the 10 and 2 o'clock positions approximately 3.5 mm from the surgical limbus. The cannula was visualized in the vitreous cavity.  The light pipe and vitreous cutter were inserted into the vitreous cavity and a core vitrectomy was performed.  Care taken to remove the vitreous up to the vitreous base for 360 degrees limiting any traction on the temporal retinal detachment and superotemporal horseshoe tears.  After careful shaving of the peripheral vitreous, the vitreous over the supertemporal retinal breaks was carefully shaved.  Endocautery was used to make a posterior retinotomy.  Subretinal fluid was drained through the posterior retinotomy.  A complete air/fluid exchange was performed.  3 rows of endolaser were applied 360 degrees to the periphery and surrounding the retinal breaks.  15% SF6 gas was placed in the eye.  The superior cannulas were sequentially removed with concommitant tamponade using a cotton tipped applicator and noted to be air tight.  The infusion line and trocar were removed and the sclerotomy was noted to be air tight with normal intraocular pressure by digital palpapation.  Subconjunctival injections  of Vancomycin and  Dexamethasone 4mg /68ml were placed in the infero-medial quadrant.   The speculum and drapes were removed and the eye was patched with Polymixin/Bacitracin ophthalmic ointment. An eye shield was placed and the patient was transferred alert and conversant with stable vital signs to the post operative recovery area.  The patient tolerated the procedure well and no complications were noted.  0m MD

## 2020-01-19 NOTE — Anesthesia Postprocedure Evaluation (Signed)
Anesthesia Post Note  Patient: Seth Rogers  Procedure(s) Performed: REPAIR OF COMPLEX TRACTION RETINAL DETACHMENT (Right )     Patient location during evaluation: PACU Anesthesia Type: MAC Level of consciousness: awake and alert Pain management: pain level controlled Vital Signs Assessment: post-procedure vital signs reviewed and stable Respiratory status: spontaneous breathing, nonlabored ventilation, respiratory function stable and patient connected to nasal cannula oxygen Cardiovascular status: stable and blood pressure returned to baseline Postop Assessment: no apparent nausea or vomiting Anesthetic complications: no   No complications documented.  Last Vitals:  Vitals:   01/19/20 0945 01/19/20 1000  BP: 121/69 125/70  Pulse: (!) 42 (!) 44  Resp: 17 (!) 22  Temp:    SpO2: 100% 100%    Last Pain:  Vitals:   01/19/20 1005  TempSrc:   PainSc: 0-No pain                 Suann Klier P Maelys Kinnick

## 2020-01-19 NOTE — H&P (Signed)
Date of examination:  01/18/2020  Indication for surgery: retinal detachment right eye  Pertinent past medical history:  Past Medical History:  Diagnosis Date  . Hypertension    dx  about 15 years  ago     Pertinent ocular history:  Flashes and floaters  Pertinent family history: History reviewed. No pertinent family history.  General:  Healthy appearing patient in no distress.    Eyes:    Acuity OD 20/200     External: Within normal limits       Anterior segment: Within normal limits          Fundus: Macula-off retinal detachment with nasal visual field cut right eye     Impression: Retinal detachment right eye  Plan:  Retinal detachment repair right eye  Carmela Rima, MD

## 2020-01-19 NOTE — Discharge Instructions (Addendum)
DO NOT SLEEP ON BACK, THE EYE PRESSURE CAN GO UP AND CAUSE VISION LOSS   SLEEP ON SIDE WITH NOSE TO PILLOW  DURING DAY KEEP FACE DOWN.  15 MINUTES EVERY 2 HOURS IT IS OK TO LOOK STRAIGHT AHEAD (USE BATHROOM, EAT, WALK, ETC.)  

## 2020-01-19 NOTE — H&P (Signed)
Date of examination:  01/19/2020  Indication for surgery: retinal detachment right eye  Pertinent past medical history:      Past Medical History:  Diagnosis Date  . Hypertension    dx  about 15 years  ago     Pertinent ocular history:  Flashes and floaters  Pertinent family history: History reviewed. No pertinent family history.  General:  Healthy appearing patient in no distress.    Eyes:               Acuity OD 20/200                External: Within normal limits                  Anterior segment: Within normal limits                                Fundus: Macula-off retinal detachment with nasal visual field cut right eye                Impression: Retinal detachment right eye  Plan:  Retinal detachment repair right eye  Carmela Rima, MD

## 2020-01-20 ENCOUNTER — Encounter (HOSPITAL_COMMUNITY): Payer: Self-pay | Admitting: Ophthalmology

## 2020-05-05 ENCOUNTER — Encounter: Payer: 59 | Attending: Surgery | Admitting: Skilled Nursing Facility1

## 2020-05-05 ENCOUNTER — Other Ambulatory Visit: Payer: Self-pay

## 2020-05-05 NOTE — Progress Notes (Signed)
Follow-up visit:  Post-Operative sleeve Surgery   Primary concerns today: Post-operative Bariatric Surgery Nutrition Management   Sleeve 12/12/2018 Wt today: 238 pounds  Pt states he had a detached retina recently so how has different depth perception and very poor vision in his eye. Pt states his wife has also gone through some physical troubles as well so he has been stressed. Pt states he has been eating more starches lately.  Pt states he weighs himself every day. Pt states he is not at the weight he wants to be but recognizes he is over eating the nuts and sweets. Pt states when he overeats he will get up and walk: Dietitian advised to stop eating before that point.   24 hr recall:  Breakfast: fast food sausage and eggs or skipped Snack: grapes  Lunch: ribye steak sandwich + vegetables or chicken + salad Snack: peanuts Dinner: chicken + broccoli + onion + green beans or hamburger + broccoli + potatoes or mac ncheese Snack: ice cream  Fluid intake: 101 oz plain water   Medications: See List Supplementation:   Using straws: no Drinking while eating: no Having you been chewing well: yes Chewing/swallowing difficulties: no Changes in vision: no Changes to mood/headaches: no Hair loss/Cahnges to skin/Changes to nails: no Any difficulty focusing or concentrating: no Sweating: no Dizziness/Lightheaded: no Palpitations: no  Carbonated beverages: no N/V/D/C/GAS: having bowel movement every 2 days Abdominal Pain: no Dumping syndrome: no  Recent physical activity:  Walking his dog 7 days a Week 10000+ steps  Progress Towards Goal(s):  In Progress  Handouts previously Given: 12 month handout folder  Goals: Continue Aim to add resistance workouts to your week either yoga or light weights Ensure you are eating lean protein and non starchy vegetables along with your carbohydrate choice continue to be aware of you car choices and reduce the sweets  Ensure you stop at  satisfaction not fullness   Teaching Method Utilized:  Visual Auditory Hands on  Demonstrated degree of understanding via:  Teach Back   Monitoring/Evaluation:  Dietary intake, exercise, and body weight.

## 2020-06-30 ENCOUNTER — Encounter (HOSPITAL_COMMUNITY): Payer: Self-pay | Admitting: *Deleted

## 2021-01-03 ENCOUNTER — Observation Stay (HOSPITAL_COMMUNITY)
Admission: EM | Admit: 2021-01-03 | Discharge: 2021-01-04 | Disposition: A | Discharge status: Home / Self Care | Payer: 59 | Attending: General Surgery | Admitting: General Surgery

## 2021-01-03 ENCOUNTER — Emergency Department (HOSPITAL_COMMUNITY): Payer: 59

## 2021-01-03 ENCOUNTER — Inpatient Hospital Stay (HOSPITAL_COMMUNITY): Payer: 59

## 2021-01-03 ENCOUNTER — Encounter (HOSPITAL_COMMUNITY): Payer: Self-pay | Admitting: Emergency Medicine

## 2021-01-03 ENCOUNTER — Other Ambulatory Visit: Payer: Self-pay

## 2021-01-03 DIAGNOSIS — Z9884 Bariatric surgery status: Secondary | ICD-10-CM | POA: Diagnosis not present

## 2021-01-03 DIAGNOSIS — Z79899 Other long term (current) drug therapy: Secondary | ICD-10-CM | POA: Insufficient documentation

## 2021-01-03 DIAGNOSIS — K56699 Other intestinal obstruction unspecified as to partial versus complete obstruction: Principal | ICD-10-CM | POA: Insufficient documentation

## 2021-01-03 DIAGNOSIS — K565 Intestinal adhesions [bands], unspecified as to partial versus complete obstruction: Principal | ICD-10-CM | POA: Diagnosis present

## 2021-01-03 DIAGNOSIS — I10 Essential (primary) hypertension: Secondary | ICD-10-CM | POA: Diagnosis not present

## 2021-01-03 DIAGNOSIS — R109 Unspecified abdominal pain: Secondary | ICD-10-CM | POA: Diagnosis present

## 2021-01-03 DIAGNOSIS — Z20822 Contact with and (suspected) exposure to covid-19: Secondary | ICD-10-CM | POA: Diagnosis not present

## 2021-01-03 DIAGNOSIS — K56609 Unspecified intestinal obstruction, unspecified as to partial versus complete obstruction: Secondary | ICD-10-CM

## 2021-01-03 DIAGNOSIS — Z880 Allergy status to penicillin: Secondary | ICD-10-CM

## 2021-01-03 DIAGNOSIS — Z0189 Encounter for other specified special examinations: Secondary | ICD-10-CM

## 2021-01-03 LAB — COMPREHENSIVE METABOLIC PANEL
ALT: 17 U/L (ref 0–44)
AST: 22 U/L (ref 15–41)
Albumin: 4.9 g/dL (ref 3.5–5.0)
Alkaline Phosphatase: 50 U/L (ref 38–126)
Anion gap: 10 (ref 5–15)
BUN: 14 mg/dL (ref 6–20)
CO2: 24 mmol/L (ref 22–32)
Calcium: 9.4 mg/dL (ref 8.9–10.3)
Chloride: 102 mmol/L (ref 98–111)
Creatinine, Ser: 0.87 mg/dL (ref 0.61–1.24)
GFR, Estimated: >60 mL/min (ref >60)
Glucose, Bld: 144 mg/dL — ABNORMAL HIGH (ref 70–99)
Potassium: 4.2 mmol/L (ref 3.5–5.1)
Sodium: 136 mmol/L (ref 135–145)
Total Bilirubin: 4 mg/dL — ABNORMAL HIGH (ref 0.3–1.2)
Total Protein: 8 g/dL (ref 6.5–8.1)

## 2021-01-03 LAB — CBC WITH DIFFERENTIAL/PLATELET
Abs Immature Granulocytes: 0.05 10*3/uL (ref 0.00–0.07)
Basophils Absolute: 0 10*3/uL (ref 0.0–0.1)
Basophils Relative: 0 %
Eosinophils Absolute: 0.1 10*3/uL (ref 0.0–0.5)
Eosinophils Relative: 0 %
HCT: 49.6 % (ref 39.0–52.0)
Hemoglobin: 16.9 g/dL (ref 13.0–17.0)
Immature Granulocytes: 0 %
Lymphocytes Relative: 7 %
Lymphs Abs: 0.8 10*3/uL (ref 0.7–4.0)
MCH: 31.8 pg (ref 26.0–34.0)
MCHC: 34.1 g/dL (ref 30.0–36.0)
MCV: 93.4 fL (ref 80.0–100.0)
Monocytes Absolute: 1.1 10*3/uL — ABNORMAL HIGH (ref 0.1–1.0)
Monocytes Relative: 9 %
Neutro Abs: 10 10*3/uL — ABNORMAL HIGH (ref 1.7–7.7)
Neutrophils Relative %: 84 %
Platelets: 202 10*3/uL (ref 150–400)
RBC: 5.31 MIL/uL (ref 4.22–5.81)
RDW: 12.7 % (ref 11.5–15.5)
WBC: 12 10*3/uL — ABNORMAL HIGH (ref 4.0–10.5)
nRBC: 0 % (ref 0.0–0.2)

## 2021-01-03 LAB — RESP PANEL BY RT-PCR (FLU A&B, COVID) ARPGX2
Influenza A by PCR: NEGATIVE
Influenza B by PCR: NEGATIVE
SARS Coronavirus 2 by RT PCR: NEGATIVE

## 2021-01-03 MED ORDER — DIATRIZOATE MEGLUMINE & SODIUM 66-10 % PO SOLN
90.0000 mL | Freq: Once | ORAL | Status: AC
  Administered 2021-01-03: 90 mL via NASOGASTRIC
  Filled 2021-01-03: qty 90

## 2021-01-03 MED ORDER — ENOXAPARIN SODIUM 40 MG/0.4ML IJ SOSY
40.0000 mg | PREFILLED_SYRINGE | INTRAMUSCULAR | Status: DC
  Administered 2021-01-03: 40 mg via SUBCUTANEOUS
  Filled 2021-01-03: qty 0.4

## 2021-01-03 MED ORDER — SODIUM CHLORIDE 0.9 % IV SOLN: INTRAVENOUS | Status: DC

## 2021-01-03 MED ORDER — ONDANSETRON HCL 4 MG/2ML IJ SOLN: 4.0000 mg | Freq: Four times a day (QID) | INTRAMUSCULAR | Status: DC | PRN

## 2021-01-03 MED ORDER — ACETAMINOPHEN 650 MG RE SUPP: 650.0000 mg | Freq: Four times a day (QID) | RECTAL | Status: DC | PRN

## 2021-01-03 MED ORDER — SIMETHICONE 80 MG PO CHEW: 40.0000 mg | CHEWABLE_TABLET | Freq: Four times a day (QID) | ORAL | Status: DC | PRN

## 2021-01-03 MED ORDER — ACETAMINOPHEN 325 MG PO TABS: 650.0000 mg | ORAL_TABLET | Freq: Four times a day (QID) | ORAL | Status: DC | PRN

## 2021-01-03 MED ORDER — MORPHINE SULFATE (PF) 2 MG/ML IV SOLN
2.0000 mg | INTRAVENOUS | Status: DC | PRN
  Administered 2021-01-03 – 2021-01-04 (×6): 2 mg via INTRAVENOUS
  Filled 2021-01-03 (×6): qty 1

## 2021-01-03 MED ORDER — MORPHINE SULFATE (PF) 4 MG/ML IV SOLN
4.0000 mg | Freq: Once | INTRAVENOUS | Status: AC
  Administered 2021-01-03: 4 mg via INTRAVENOUS
  Filled 2021-01-03: qty 1

## 2021-01-03 MED ORDER — IOHEXOL 350 MG/ML SOLN
100.0000 mL | Freq: Once | INTRAVENOUS | Status: AC | PRN
  Administered 2021-01-03: 100 mL via INTRAVENOUS

## 2021-01-03 MED ORDER — PHENOL 1.4 % MT LIQD
1.0000 | OROMUCOSAL | Status: DC | PRN
  Filled 2021-01-03 (×3): qty 177

## 2021-01-03 MED ORDER — ONDANSETRON 4 MG PO TBDP: 4.0000 mg | ORAL_TABLET | Freq: Four times a day (QID) | ORAL | Status: DC | PRN

## 2021-01-03 MED ORDER — SODIUM CHLORIDE 0.9 % IV BOLUS
1000.0000 mL | Freq: Once | INTRAVENOUS | Status: AC
  Administered 2021-01-03: 1000 mL via INTRAVENOUS

## 2021-01-03 NOTE — H&P (Addendum)
Seth Rogers is an 60 y.o. male.   Chief Complaint: ab pain, unable to have bm HPI: This is a 60 year old male with a past medical history significant for hypertension and several abdominal surgeries including a cholecystectomy, ventral hernia repair x2 (last surgery 2016-12 cm diameter Covidien Parietex composite mesh placed) and a laparoscopic gastric sleeve for weight loss in 2020.  He presents with several days of worsening abdominal pain nausea and emesis.  He is unable to keep anything down for the past 24 hours.  He passed some flatus this morning but otherwise has no bowel movement or no flatus in the last 24 hours.  He attempted at home to take some MiraLAX but is this is not had any effect.  Nothing he was doing at home is making this better.  He denies any fever.  He was brought to the emergency room and found on his CT scan to have what appears to be a small bowel obstruction.  I was asked to see him.  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 Social History:  reports that he has never smoked. He has never used smokeless tobacco. He reports current alcohol use. He reports that he does not use drugs.  Allergies:  Allergies  Allergen Reactions   Penicillins Rash    No current facility-administered  medications on file prior to encounter.   Current Outpatient Medications on File Prior to Encounter  Medication Sig Dispense Refill   acetaminophen (TYLENOL) 500 MG tablet Take 1,000 mg by mouth every 6 (six) hours as needed (for pain.).     amLODipine (NORVASC) 5 MG tablet Take 5 mg by mouth daily.     atorvastatin (LIPITOR) 20 MG tablet Take 20 mg by mouth daily.     Multiple Vitamins-Minerals (CELEBRATE MULTI-COMPLETE 18 PO) Take 1 tablet by mouth every evening.     telmisartan (MICARDIS) 80 MG tablet Take 80 mg by mouth daily.     amLODipine (NORVASC) 2.5 MG tablet Take 2.5 mg by mouth daily.        Results for orders placed or performed during the hospital encounter of 01/03/21 (from the past 48 hour(s))  Comprehensive metabolic panel     Status: Abnormal   Collection Time: 01/03/21  9:43 AM  Result Value Ref Range   Sodium 136 135 - 145 mmol/L   Potassium 4.2 3.5 - 5.1 mmol/L   Chloride 102 98 - 111 mmol/L   CO2 24 22 - 32 mmol/L   Glucose, Bld 144 (H) 70 - 99 mg/dL    Comment: Glucose reference range applies only to samples taken after fasting for at least 8 hours.   BUN 14 6 - 20 mg/dL   Creatinine, Ser 9.70 0.61 -  1.24 mg/dL   Calcium 9.4 8.9 - 40.1 mg/dL   Total Protein 8.0 6.5 - 8.1 g/dL   Albumin 4.9 3.5 - 5.0 g/dL   AST 22 15 - 41 U/L   ALT 17 0 - 44 U/L   Alkaline Phosphatase 50 38 - 126 U/L   Total Bilirubin 4.0 (H) 0.3 - 1.2 mg/dL   GFR, Estimated >02 >72 mL/min    Comment: (NOTE) Calculated using the CKD-EPI Creatinine Equation (2021)    Anion gap 10 5 - 15    Comment: Performed at [facility], [address], [place], [place] [address]  CBC with Differential     Status: Abnormal   Collection Time: 01/03/21  9:43 AM  Result Value Ref Range   WBC 12.0 (H) 4.0 - 10.5 K/uL   RBC 5.31 4.22 - 5.81 MIL/uL   Hemoglobin 16.9 13.0 - 17.0 g/dL   HCT 40.3 47.4 - 25.9 %   MCV 93.4 80.0 - 100.0 fL   MCH 31.8 26.0 - 34.0 pg   MCHC 34.1 30.0 -  36.0 g/dL   RDW 56.3 87.5 - 64.3 %   Platelets 202 150 - 400 K/uL   nRBC 0.0 0.0 - 0.2 %   Neutrophils Relative % 84 %   Neutro Abs 10.0 (H) 1.7 - 7.7 K/uL   Lymphocytes Relative 7 %   Lymphs Abs 0.8 0.7 - 4.0 K/uL   Monocytes Relative 9 %   Monocytes Absolute 1.1 (H) 0.1 - 1.0 K/uL   Eosinophils Relative 0 %   Eosinophils Absolute 0.1 0.0 - 0.5 K/uL   Basophils Relative 0 %   Basophils Absolute 0.0 0.0 - 0.1 K/uL   Immature Granulocytes 0 %   Abs Immature Granulocytes 0.05 0.00 - 0.07 K/uL    Comment: Performed at [facility], [address] [place], [place] [address]   CT ABDOMEN PELVIS W CONTRAST  Result Date: 01/03/2021 CLINICAL DATA:  Left upper quadrant pain, suspected diverticulitis EXAM: CT ABDOMEN AND PELVIS WITH CONTRAST TECHNIQUE: Multidetector CT imaging of the abdomen and pelvis was performed using the standard protocol following bolus administration of intravenous contrast. CONTRAST:  OMNIPAQUE IOHEXOL 350 MG/ML SOLN COMPARISON:  11/04/2014 FINDINGS: Lower chest: No acute abnormality. Hepatobiliary: No focal liver abnormality is seen. Status post cholecystectomy. No biliary dilatation. Pancreas: Unremarkable. No pancreatic ductal dilatation or surrounding inflammatory changes. Spleen: Normal in size without focal abnormality. Adrenals/Urinary Tract: Adrenal glands are unremarkable. Kidneys are normal, without renal calculi, focal lesion, or hydronephrosis. Bladder is unremarkable. Stomach/Bowel: Staple line along the greater curvature of the decompressed stomach. Duodenum is decompressed. There is dilatation of mid and distal small bowel with fecalization of contents in the distal ileum just proximal to the transition point to decompressed terminal ileum and ileocecal valve. No discrete mass or abscess. Normal appendix. The colon is nondistended. Scattered descending diverticula without significant adjacent inflammatory/edematous change.  Vascular/Lymphatic: Moderate scattered calcified aortic plaque without aneurysm or stenosis. No abdominal or pelvic adenopathy. Reproductive: Prostate is unremarkable. Other: Small volume ascites, mostly in the pelvis.  No free air. Musculoskeletal: Surgical clip and mesh from ventral hernia repair. IMPRESSION: 1. Small bowel obstruction in the terminal ileum, without perforation or abscess. 2. Small volume pelvic ascites. 3. Descending colon diverticulosis without CT evidence of diverticulitis or abscess 4. Postop changes as above. Electronically Signed   By: Corlis Leak M.D.   On: 01/03/2021 12:01    Review of Systems  Constitutional:  Negative for  fever.  Gastrointestinal:  Positive for abdominal pain, constipation, nausea and vomiting. Negative for blood in stool.  All other systems reviewed and are negative.  Blood pressure (!) 146/91, pulse 88, temperature 97.8 F (36.6 C), temperature source Oral, resp. rate 18, height 6' (1.829 m), weight 107.5 kg, SpO2 100 %. Physical Exam Constitutional:      Appearance: Normal appearance.  Cardiovascular:     Rate and Rhythm: Normal rate.  Pulmonary:     Effort: Pulmonary effort is normal.  Abdominal:     Palpations: Abdomen is soft.     Tenderness: There is abdominal tenderness (mild upper abdomen with some distention). There is no guarding.     Hernia: No hernia is present.  Musculoskeletal:     Right lower leg: No edema.     Left lower leg: No edema.  Skin:    Capillary Refill: Capillary refill takes less than 2 seconds.  Neurological:     General: No focal deficit present.     Mental Status: He is alert.  Psychiatric:        Mood and Affect: Mood normal.        Behavior: Behavior normal.     Assessment/Plan Small bowel obstruction, likely adhesive -He appears to have a small bowel obstruction likely related to his prior hernia repairs.  His white count is mildly elevated at 12.  His vitals is otherwise normal.  His exam is not very  concerning at this time although on his CT scan he does have a very small volume of pelvic ascites.  There does appear to be in the mid to distal small bowel and a transition point.  I think it is reasonable to give him a trial of nonoperative therapy.  We discussed nasogastric tube placement even with the sleeve and to do the Gastrografin protocol.  We discussed indications for operation including worsening of his symptoms and exam as well as no progression.  Will start his home meds with sips.  We will also begin him on Lovenox and SCDs.  Emelia Loron, MD 01/03/2021, 1:28 PM

## 2021-01-03 NOTE — ED Triage Notes (Signed)
Patient complains of emesis and abdominal pain since this Wednesday, reports last BM was Tuesday, normally has BM's daily. States brown/green/black emesis. Throws up everything he eats.

## 2021-01-03 NOTE — ED Provider Notes (Signed)
Emergency Medicine Provider Triage Evaluation Note  Seth Rogers , a 60 y.o. male  was evaluated in triage.  Pt complains of abdominal pain and vomiting. States that last bowel movement was Wednesday when he states he has not been able to go to the bathroom since.  He states that he has had intermittent, nonradiating, upper abdominal pain that has progressed to constant abdominal pain since last night.  States that last night he began having none bloody emesis.  States he has had about 6 episodes of vomiting.  Attempted to take 2 doses of MiraLAX last night without relief of symptoms.  He does state he passed gas today before coming here.  Denies fevers, hematemesis, hematochezia or melena.  Has had abdominal surgeries before including gastric bypass, cholecystectomy, hernia surgeries.  Denies complications from this previously.  Review of Systems  Positive: Nausea, vomiting, abdominal pain, emesis Negative: Diarrhea  Physical Exam  BP (!) 158/105 (BP Location: Left Arm)   Pulse 95   Temp 97.8 F (36.6 C) (Oral)   Resp 18   Ht 6' (1.829 m)   Wt 107.5 kg   SpO2 98%   BMI 32.14 kg/m  Gen:   Awake, no distress   Resp:  Normal effort  MSK:   Moves extremities without difficulty  Other:  Abdomen is rounded, soft without distention or guarding.  Bowel sounds present on the right upper and right lower quadrants however diminished to absent on the left upper or left lower quadrants.  No increasing abdominal tenderness to palpation.  No rebound tenderness.  Medical Decision Making  Medically screening exam initiated at 9:38 AM.  Appropriate orders placed.  Oz Zappone was informed that the remainder of the evaluation will be completed by another provider, this initial triage assessment does not replace that evaluation, and the importance of remaining in the ED until their evaluation is complete.     Cristopher Peru, PA-C 01/03/21 [id number]    Mancel Bale, MD 01/04/21 [phone]

## 2021-01-03 NOTE — ED Provider Notes (Signed)
[facility] Note   CSN: [id number] Arrival date & time: 01/03/21  4098     History No chief complaint on file.   Seth Rogers is a 60 y.o. male with a past medical history significant for hypertension, multiple abdominal surgeries including cholecystectomy, hernia repair x2, laparoscopic gastric sleeve resection in 2020 who presents with 4 days of worsening abdominal pain, with new onset nausea, vomiting last night.  Patient reports that he has been constipated since Wednesday, with normal appetite, no nausea, no vomiting, and normal food and fluid intake.  Patient reports that yesterday he began having worsening abdominal pain, with 5-6 episodes of non-bloody emesis.  Patient denies rectal pain, obstipation.  Patient reports that he took MiraLAX x2 yesterday.  Patient denies any complication with previous abdominal surgeries.  Patient denies chest pain, shortness of breath, dysuria.  The pain was 10 out of 10 and sharp last night. Patient denies fevers, chills, recent illness. Patient has passed gas this morning.  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   Morbid obesity (HCC) 12/10/2018   Essential hypertension 12/10/2018    Past Surgical History:  Procedure Laterality Date   CHOLECYSTECTOMY  over 20 years ago   HERNIA REPAIR     abdominal 2003   HERNIA REPAIR  2017   abdominal    LAPAROSCOPIC GASTRIC SLEEVE RESECTION N/A 12/12/2018   Procedure: LAPAROSCOPIC GASTRIC SLEEVE RESECTION, Upper Endo, ERAS Pathway;  Surgeon: Luretha Murphy, MD;  Location: [facility];  Service: General;  Laterality: N/A;   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  Social History   Tobacco Use   Smoking status: Never   Smokeless tobacco: Never  Vaping Use   Vaping Use: Never used  Substance Use Topics   Alcohol use: Yes    Comment: occ   Drug use: No    Home Medications Prior to Admission medications   Medication Sig Start Date End Date Taking? Authorizing Provider  acetaminophen (TYLENOL) 500 MG tablet Take 1,000 mg by mouth every 6 (six) hours as needed (for pain.).   Yes [provider]  amLODipine (NORVASC) 5 MG tablet Take 5 mg by mouth daily. 12/21/20  Yes [provider]  atorvastatin (LIPITOR) 20 MG tablet Take 20 mg by mouth daily. 10/27/19  Yes [provider]  Multiple Vitamins-Minerals (CELEBRATE MULTI-COMPLETE 18 PO) Take 1 tablet by mouth every evening.   Yes [provider]  telmisartan (MICARDIS) 80 MG tablet Take 80 mg by mouth daily. 08/26/18  Yes [provider]  amLODipine (NORVASC) 2.5 MG tablet Take 2.5 mg by mouth daily. 10/29/19   [provider]    Allergies    Penicillins  Review of Systems   Review of Systems  Gastrointestinal:  Positive for abdominal pain, constipation, nausea and vomiting.  All other systems reviewed and are negative.  Physical Exam Updated Vital Signs BP (!) 153/100   Pulse 90   Temp 97.8 F (36.6 C) (Oral)  Resp 18   Ht 6' (1.829 m)   Wt 107.5 kg   SpO2 100%   BMI 32.14 kg/m   Physical Exam Vitals and nursing note reviewed.  Constitutional:      General: He is not in acute distress.    Appearance: Normal appearance.  HENT:     Head: Normocephalic and atraumatic.  Eyes:     General:        Right eye: No discharge.        Left eye: No discharge.  Cardiovascular:     Rate and Rhythm: Normal rate and regular rhythm.     Heart sounds: No murmur heard.   No friction rub. No gallop.   Pulmonary:     Effort: Pulmonary effort is normal.     Breath sounds: Normal breath sounds.  Abdominal:     General: Bowel sounds are normal.     Palpations: Abdomen is soft.     Comments: Tenderness to palpation in left upper and left lower quadrants.  Minimal to no tenderness to palpation in right upper or right lower quadrant.  Visualization of abdomen does not reveal any hemorrhage, bruising, or other abnormality, there are well-healed scars from previous surgeries, there is no evidence of any hernia.  No suprapubic tenderness to palpation.  Skin:    General: Skin is warm and dry.     Capillary Refill: Capillary refill takes less than 2 seconds.  Neurological:     Mental Status: He is alert and oriented to person, place, and time.  Psychiatric:        Mood and Affect: Mood normal.        Behavior: Behavior normal.    ED Results / Procedures / Treatments   Labs (all labs ordered are listed, but only abnormal results are displayed) Labs Reviewed  COMPREHENSIVE METABOLIC PANEL - Abnormal; Notable for the following components:      Result Value   Glucose, Bld 144 (*)    Total Bilirubin 4.0 (*)    All other components within normal limits  CBC WITH DIFFERENTIAL/PLATELET - Abnormal; Notable for the following components:   WBC 12.0 (*)    Neutro Abs 10.0 (*)    Monocytes Absolute 1.1 (*)    All other components within normal limits  RESP PANEL BY RT-PCR (FLU A&B, COVID) [id number]    EKG None  Radiology CT ABDOMEN PELVIS W CONTRAST  Result Date: 01/03/2021 CLINICAL DATA:  Left upper quadrant pain, suspected diverticulitis EXAM: CT ABDOMEN AND PELVIS WITH CONTRAST TECHNIQUE: Multidetector CT imaging of the abdomen and pelvis was performed using the standard protocol following bolus administration of intravenous contrast. CONTRAST:  OMNIPAQUE IOHEXOL 350 MG/ML SOLN COMPARISON:  11/04/2014 FINDINGS: Lower chest: No acute abnormality. Hepatobiliary: No focal liver abnormality is  seen. Status post cholecystectomy. No biliary dilatation. Pancreas: Unremarkable. No pancreatic ductal dilatation or surrounding inflammatory changes. Spleen: Normal in size without focal abnormality. Adrenals/Urinary Tract: Adrenal glands are unremarkable. Kidneys are normal, without renal calculi, focal lesion, or hydronephrosis. Bladder is unremarkable. Stomach/Bowel: Staple line along the greater curvature of the decompressed stomach. Duodenum is decompressed. There is dilatation of mid and distal small bowel with fecalization of contents in the distal ileum just proximal to the transition point to decompressed terminal ileum and ileocecal valve. No discrete mass or abscess. Normal appendix. The colon is nondistended. Scattered descending diverticula without significant adjacent inflammatory/edematous change. Vascular/Lymphatic: Moderate scattered calcified aortic plaque without aneurysm or stenosis. No abdominal or pelvic adenopathy. Reproductive:  Prostate is unremarkable. Other: Small volume ascites, mostly in the pelvis.  No free air. Musculoskeletal: Surgical clip and mesh from ventral hernia repair. IMPRESSION: 1. Small bowel obstruction in the terminal ileum, without perforation or abscess. 2. Small volume pelvic ascites. 3. Descending colon diverticulosis without CT evidence of diverticulitis or abscess 4. Postop changes as above. Electronically Signed   By: Corlis Leak M.D.   On: 01/03/2021 12:01    Procedures Procedures   Medications Ordered in ED Medications  sodium chloride 0.9 % bolus 1,000 mL (1,000 mLs Intravenous Bolus 01/03/21 1017)  morphine 4 MG/ML injection 4 mg (4 mg Intravenous Given 01/03/21 1018)  iohexol (OMNIPAQUE) 350 MG/ML injection 100 mL (100 mLs Intravenous Contrast Given 01/03/21 1137)  morphine 4 MG/ML injection 4 mg (4 mg Intravenous Given 01/03/21 1230)    ED Course  I have reviewed the triage vital signs and the nursing notes.  Pertinent labs & imaging results  that were available during my care of the patient were reviewed by me and considered in my medical decision making (see chart for details).    MDM Rules/Calculators/A&P                         Patient with worsening abdominal pain, constipation, extensive surgical history.  Do have some clinical suspicion for an obstruction at this time based on patient's symptoms and presentation.  Hopefully an early obstruction as patient was able to pass flatus this morning.  Patient denies passing any fecalized vomitus.  Patient without any nausea or vomiting on his presentation today.  Patient is in significant pain.  CT of the abdomen and pelvis obtained given high risk for obstruction, differential includes diverticulitis, splenic infarct.  Minimal clinical suspicion for appendicitis, mesenteric ischemia, peritonitis, or abdominal rupture at this time based on patient's presentation physical exam.  CT of the abdomen pelvis did reveal small bowel obstruction in the site of the terminal ileum without any evidence of an obstructing mass, there is a transition point at this location, with decompressed bowel behind the ileum.  Patient is n.p.o. at this time, will consult surgery for recommendations regarding bowel obstruction, anticipate potential NG tube decompression, and monitoring.  No evidence of abscess or rupture at this time.  Spoke with Dr. Dwain Sarna with surgery he does want patient admitted at this time, will place NG tube, order COVID test for hospital admission.  Patient to be admitted to the surgical service. Final Clinical Impression(s) / ED Diagnoses Final diagnoses:  Small bowel obstruction [facility])    Rx / DC Orders ED Discharge Orders     None        [place] 01/03/21 1339    Mancel Bale, MD 01/04/21 [phone]

## 2021-01-04 ENCOUNTER — Encounter (HOSPITAL_COMMUNITY): Payer: Self-pay

## 2021-01-04 LAB — CBC
HCT: 42.7 % (ref 39.0–52.0)
Hemoglobin: 14 g/dL (ref 13.0–17.0)
MCH: 31.8 pg (ref 26.0–34.0)
MCHC: 32.8 g/dL (ref 30.0–36.0)
MCV: 97 fL (ref 80.0–100.0)
Platelets: 123 10*3/uL — ABNORMAL LOW (ref 150–400)
RBC: 4.4 MIL/uL (ref 4.22–5.81)
RDW: 12.9 % (ref 11.5–15.5)
WBC: 4.1 10*3/uL (ref 4.0–10.5)
nRBC: 0 % (ref 0.0–0.2)

## 2021-01-04 LAB — BASIC METABOLIC PANEL
Anion gap: 6 (ref 5–15)
BUN: 16 mg/dL (ref 6–20)
CO2: 25 mmol/L (ref 22–32)
Calcium: 8.3 mg/dL — ABNORMAL LOW (ref 8.9–10.3)
Chloride: 107 mmol/L (ref 98–111)
Creatinine, Ser: 0.82 mg/dL (ref 0.61–1.24)
GFR, Estimated: >60 mL/min (ref >60)
Glucose, Bld: 124 mg/dL — ABNORMAL HIGH (ref 70–99)
Potassium: 3.9 mmol/L (ref 3.5–5.1)
Sodium: 138 mmol/L (ref 135–145)

## 2021-01-04 NOTE — Progress Notes (Signed)
Pt stable at time of d/c instructions and education. No needs at this time. Pt to d/c home with family.

## 2021-01-04 NOTE — Plan of Care (Signed)
Pt stable at this time. Pt to d/c home in stable condition with family.

## 2021-01-04 NOTE — Plan of Care (Signed)
  Problem: Clinical Measurements: Goal: Diagnostic test results will improve Outcome: Progressing   Problem: Clinical Measurements: Goal: Respiratory complications will improve Outcome: Progressing   Problem: Clinical Measurements: Goal: Cardiovascular complication will be avoided Outcome: Progressing   Problem: Elimination: Goal: Will not experience complications related to bowel motility Outcome: Progressing   

## 2021-01-04 NOTE — Progress Notes (Signed)
Pt reports three bowel movements last night and reports wanting his ng tube out very quickly. Stating it is really bothering him. Pt remains stable tough pain remains. Rn will continue to monitor.

## 2021-01-04 NOTE — Progress Notes (Signed)
Subjective/Chief Complaint: Having bms, back to normal   Objective: Vital signs in last 24 hours: Temp:  [97.7 F (36.5 C)-98.6 F (37 C)] 98.6 F (37 C) (10/23 0514) Pulse Rate:  [73-95] 73 (10/23 0514) Resp:  [14-18] 16 (10/23 0514) BP: (142-164)/(75-105) 142/79 (10/23 0514) SpO2:  [93 %-100 %] 97 % (10/23 0514) Weight:  [107.5 kg] 107.5 kg (10/22 0930) Last BM Date: 01/03/21  Intake/Output from previous day: 10/22 0701 - 10/23 0700 In: 1024.6 [I.V.:1024.6] Out: 450 [Emesis/NG output:450] Intake/Output this shift: No intake/output data recorded.  General appearance: no distress GI: soft nontender nondistended  Lab Results:  Recent Labs    01/03/21 0943 01/04/21 0406  WBC 12.0* 4.1  HGB 16.9 14.0  HCT 49.6 42.7  PLT 202 123*   BMET Recent Labs    01/03/21 0943 01/04/21 0406  NA 136 138  K 4.2 3.9  CL 102 107  CO2 24 25  GLUCOSE 144* 124*  BUN 14 16  CREATININE 0.87 0.82  CALCIUM 9.4 8.3*   PT/INR No results for input(s): LABPROT, INR in the last 72 hours. ABG No results for input(s): PHART, HCO3 in the last 72 hours.  Invalid input(s): PCO2, PO2  Studies/Results: CT ABDOMEN PELVIS W CONTRAST  Result Date: 01/03/2021 CLINICAL DATA:  Left upper quadrant pain, suspected diverticulitis EXAM: CT ABDOMEN AND PELVIS WITH CONTRAST TECHNIQUE: Multidetector CT imaging of the abdomen and pelvis was performed using the standard protocol following bolus administration of intravenous contrast. CONTRAST:  OMNIPAQUE IOHEXOL 350 MG/ML SOLN COMPARISON:  11/04/2014 FINDINGS: Lower chest: No acute abnormality. Hepatobiliary: No focal liver abnormality is seen. Status post cholecystectomy. No biliary dilatation. Pancreas: Unremarkable. No pancreatic ductal dilatation or surrounding inflammatory changes. Spleen: Normal in size without focal abnormality. Adrenals/Urinary Tract: Adrenal glands are unremarkable. Kidneys are normal, without renal calculi, focal  lesion, or hydronephrosis. Bladder is unremarkable. Stomach/Bowel: Staple line along the greater curvature of the decompressed stomach. Duodenum is decompressed. There is dilatation of mid and distal small bowel with fecalization of contents in the distal ileum just proximal to the transition point to decompressed terminal ileum and ileocecal valve. No discrete mass or abscess. Normal appendix. The colon is nondistended. Scattered descending diverticula without significant adjacent inflammatory/edematous change. Vascular/Lymphatic: Moderate scattered calcified aortic plaque without aneurysm or stenosis. No abdominal or pelvic adenopathy. Reproductive: Prostate is unremarkable. Other: Small volume ascites, mostly in the pelvis.  No free air. Musculoskeletal: Surgical clip and mesh from ventral hernia repair. IMPRESSION: 1. Small bowel obstruction in the terminal ileum, without perforation or abscess. 2. Small volume pelvic ascites. 3. Descending colon diverticulosis without CT evidence of diverticulitis or abscess 4. Postop changes as above. Electronically Signed   By: Corlis Leak M.D.   On: 01/03/2021 12:01   DG Abd Portable 1V-Small Bowel Obstruction Protocol-initial, 8 hr delay  Result Date: 01/04/2021 CLINICAL DATA:  Small bowel obstruction, 8 hour delay. EXAM: PORTABLE ABDOMEN - 1 VIEW COMPARISON:  Abdominopelvic CT yesterday. FINDINGS: Enteric tube tip and side-port below the diaphragm in the stomach. There is enteric contrast within the ascending colon. Improving gaseous small bowel distension in the central abdomen. Small bowel measures up to 2.9 cm. Cholecystectomy clips in the right upper quadrant. Excreted IV contrast within the urinary bladder. IMPRESSION: Enteric contrast within the ascending colon. Improving small bowel distention since CT yesterday. Electronically Signed   By: Narda Rutherford M.D.   On: 01/04/2021 00:57    Anti-infectives: Anti-infectives (From admission, onward)    None  Assessment/Plan: Small bowel obstruction, likely adhesive -wbc normal, contrast in colon, exam normal -I took ng out this am -fulls and home today, discussed advancing diet at home -discussed recurrence early and late -no follow up needed Emelia Loron 01/04/2021

## 2021-01-05 NOTE — Discharge Summary (Addendum)
Physician Discharge Summary  Patient ID: Seth Rogers MRN: [id number] DOB/AGE: 1960-07-21 60 y.o.  Admit date: 01/03/2021 Discharge date: 01/04/21  Admission Diagnoses: HTN  Small bowel obstruction Prior sleeve gastrectomy and mesh hernia repair  Discharge Diagnoses:  Active Problems:   SBO (small bowel obstruction) (HCC)   Discharged Condition: good  Hospital Course: 60 yom with prior ventral hernia with mesh and lap sleeve presents with signs and symptoms as well as a ct scan consistent with a SBO.  He was admitted with NG tube and underwent gastrograffin protocol.  Contrast passed to his colon on first xray and he had return of bowel function. He tolerated diet and was discharged home.   Consults: None  Significant Diagnostic Studies: radiology: CT scan: sbo  Treatments: IV hydration  Discharge Exam: Blood pressure 136/79, pulse 61, temperature 98.6 F (37 C), temperature source Oral, resp. rate 18, height 6' (1.829 m), weight 107.5 kg, SpO2 98 %. GI: soft, non-tender; bowel sounds normal; no masses,  no organomegaly  Disposition: Discharge disposition: 01-Home or Self Care        Allergies as of 01/04/2021       Reactions   Penicillins Rash        Medication List     TAKE these medications    acetaminophen 500 MG tablet Commonly known as: TYLENOL Take 1,000 mg by mouth every 6 (six) hours as needed (for pain.).   amLODipine 5 MG tablet Commonly known as: NORVASC Take 5 mg by mouth daily. What changed: Another medication with the same name was removed. Continue taking this medication, and follow the directions you see here.   atorvastatin 20 MG tablet Commonly known as: LIPITOR Take 20 mg by mouth daily.   CELEBRATE MULTI-COMPLETE 18 PO Take 1 tablet by mouth every evening.   telmisartan 80 MG tablet Commonly known as: MICARDIS Take 80 mg by mouth daily.         Signed: Emelia Loron 01/05/2021, 10:23 AM

## 2021-06-08 DIAGNOSIS — H35412 Lattice degeneration of retina, left eye: Secondary | ICD-10-CM | POA: Diagnosis not present

## 2021-06-08 DIAGNOSIS — H31092 Other chorioretinal scars, left eye: Secondary | ICD-10-CM | POA: Diagnosis not present

## 2021-06-08 DIAGNOSIS — H33322 Round hole, left eye: Secondary | ICD-10-CM | POA: Diagnosis not present

## 2021-06-08 DIAGNOSIS — H35371 Puckering of macula, right eye: Secondary | ICD-10-CM | POA: Diagnosis not present

## 2021-06-08 DIAGNOSIS — H25812 Combined forms of age-related cataract, left eye: Secondary | ICD-10-CM | POA: Diagnosis not present

## 2021-06-08 DIAGNOSIS — H338 Other retinal detachments: Secondary | ICD-10-CM | POA: Diagnosis not present

## 2021-07-03 ENCOUNTER — Encounter (HOSPITAL_COMMUNITY): Payer: Self-pay | Admitting: *Deleted

## 2021-10-07 DIAGNOSIS — Z880 Allergy status to penicillin: Secondary | ICD-10-CM | POA: Diagnosis not present

## 2021-10-07 DIAGNOSIS — Z8051 Family history of malignant neoplasm of kidney: Secondary | ICD-10-CM | POA: Diagnosis not present

## 2021-10-07 DIAGNOSIS — Z6835 Body mass index (BMI) 35.0-35.9, adult: Secondary | ICD-10-CM | POA: Diagnosis not present

## 2021-10-07 DIAGNOSIS — I10 Essential (primary) hypertension: Secondary | ICD-10-CM | POA: Diagnosis not present

## 2021-10-07 DIAGNOSIS — E785 Hyperlipidemia, unspecified: Secondary | ICD-10-CM | POA: Diagnosis not present

## 2021-10-07 DIAGNOSIS — Z8249 Family history of ischemic heart disease and other diseases of the circulatory system: Secondary | ICD-10-CM | POA: Diagnosis not present

## 2021-11-11 DIAGNOSIS — D696 Thrombocytopenia, unspecified: Secondary | ICD-10-CM | POA: Diagnosis not present

## 2021-11-11 DIAGNOSIS — E669 Obesity, unspecified: Secondary | ICD-10-CM | POA: Diagnosis not present

## 2021-11-11 DIAGNOSIS — Z125 Encounter for screening for malignant neoplasm of prostate: Secondary | ICD-10-CM | POA: Diagnosis not present

## 2021-11-11 DIAGNOSIS — I10 Essential (primary) hypertension: Secondary | ICD-10-CM | POA: Diagnosis not present

## 2021-11-11 DIAGNOSIS — E785 Hyperlipidemia, unspecified: Secondary | ICD-10-CM | POA: Diagnosis not present

## 2021-12-09 DIAGNOSIS — H25812 Combined forms of age-related cataract, left eye: Secondary | ICD-10-CM | POA: Diagnosis not present

## 2021-12-09 DIAGNOSIS — Z961 Presence of intraocular lens: Secondary | ICD-10-CM | POA: Diagnosis not present

## 2021-12-09 DIAGNOSIS — H33322 Round hole, left eye: Secondary | ICD-10-CM | POA: Diagnosis not present

## 2021-12-09 DIAGNOSIS — H35371 Puckering of macula, right eye: Secondary | ICD-10-CM | POA: Diagnosis not present

## 2021-12-09 DIAGNOSIS — H59811 Chorioretinal scars after surgery for detachment, right eye: Secondary | ICD-10-CM | POA: Diagnosis not present

## 2022-02-23 DIAGNOSIS — E669 Obesity, unspecified: Secondary | ICD-10-CM | POA: Diagnosis not present

## 2022-02-23 DIAGNOSIS — E785 Hyperlipidemia, unspecified: Secondary | ICD-10-CM | POA: Diagnosis not present

## 2022-02-23 DIAGNOSIS — I10 Essential (primary) hypertension: Secondary | ICD-10-CM | POA: Diagnosis not present

## 2022-07-02 ENCOUNTER — Encounter (HOSPITAL_COMMUNITY): Payer: Self-pay | Admitting: *Deleted

## 2023-06-03 IMAGING — DX DG ABD PORTABLE 1V
2 series · 2 of 2 positions shown · non-contrast
Comparison: Abdominopelvic CT yesterday.

CLINICAL DATA: Small bowel obstruction, 8 hour delay.

EXAM:
PORTABLE ABDOMEN - 1 VIEW

[abdomen kub (1 of 2)]
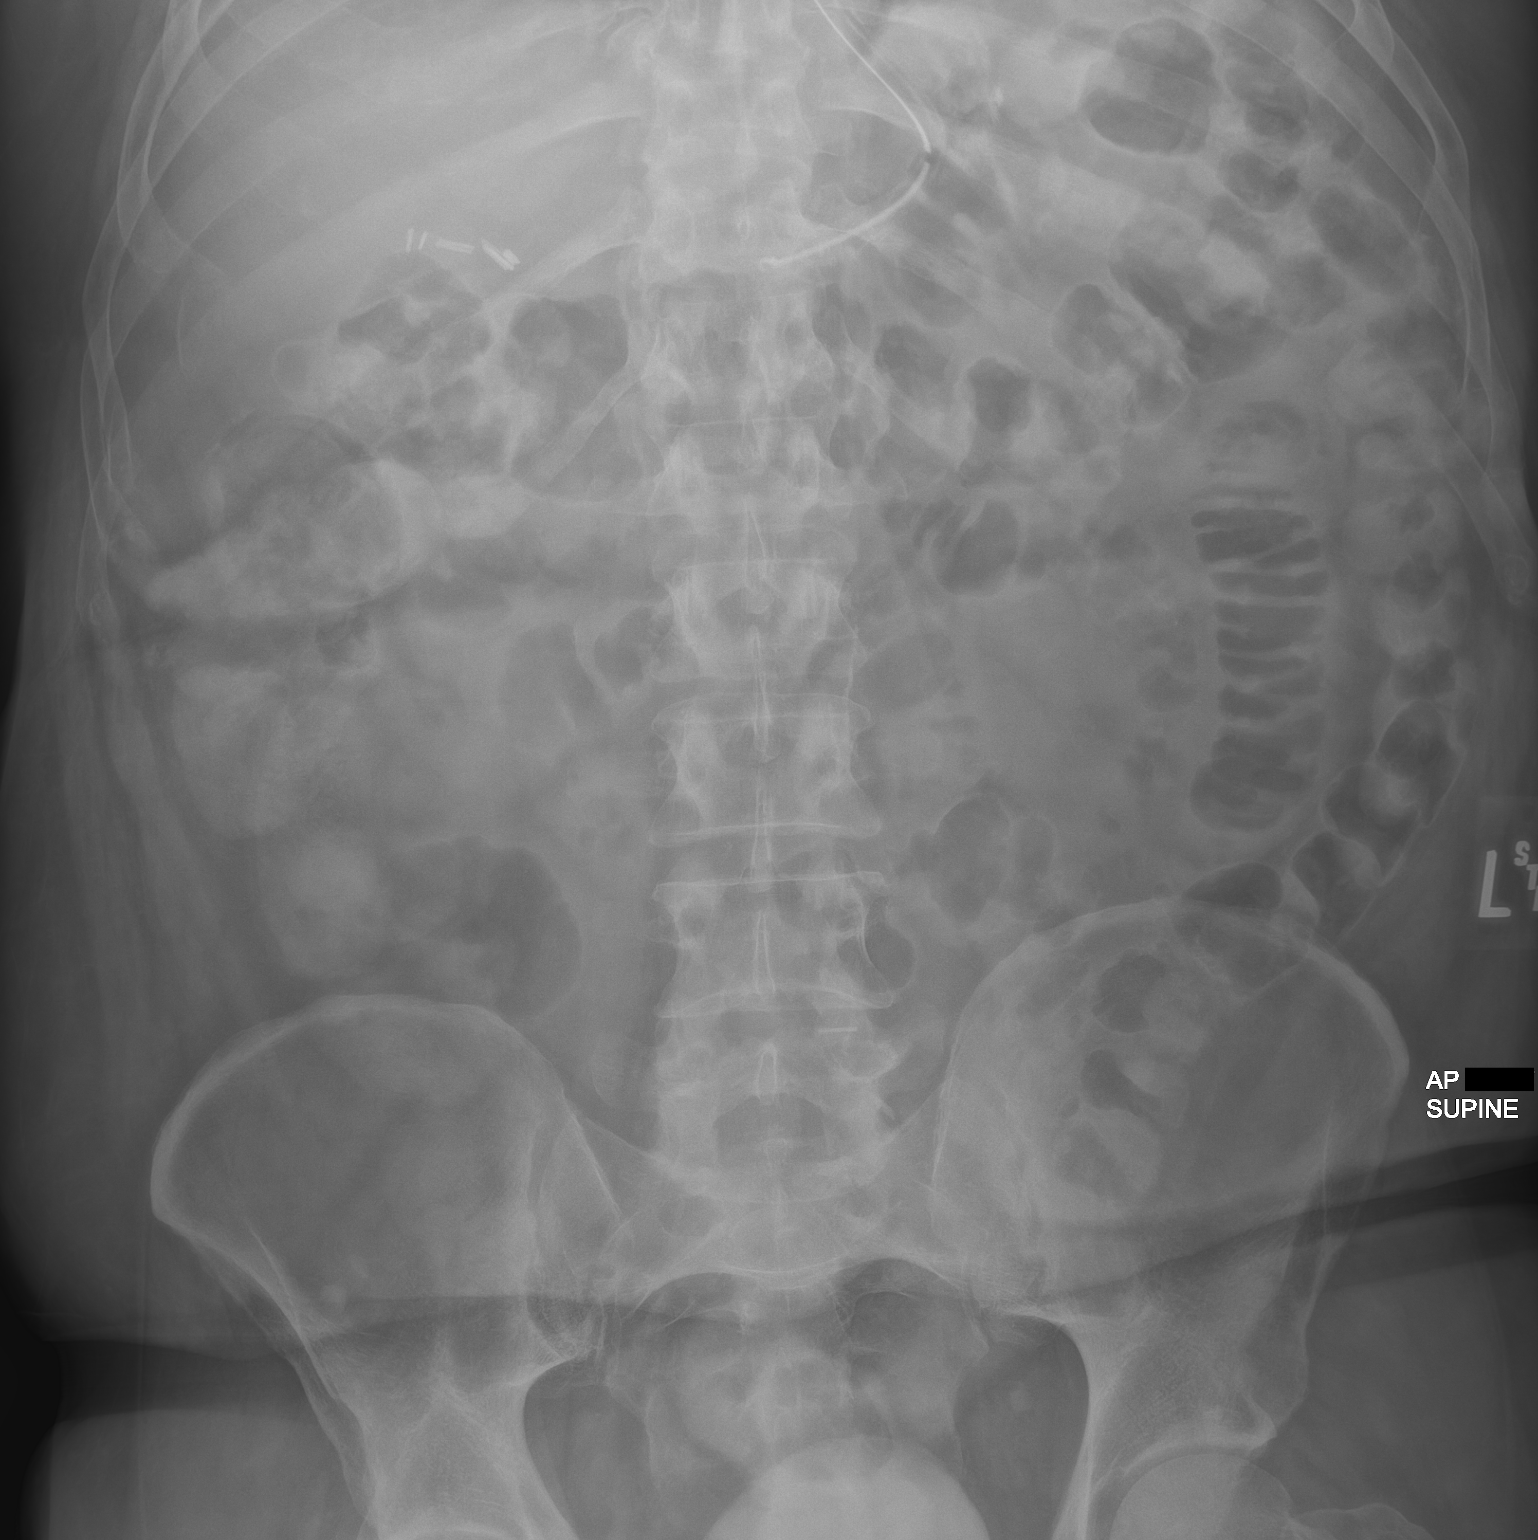

[abdomen kub (2 of 2)]
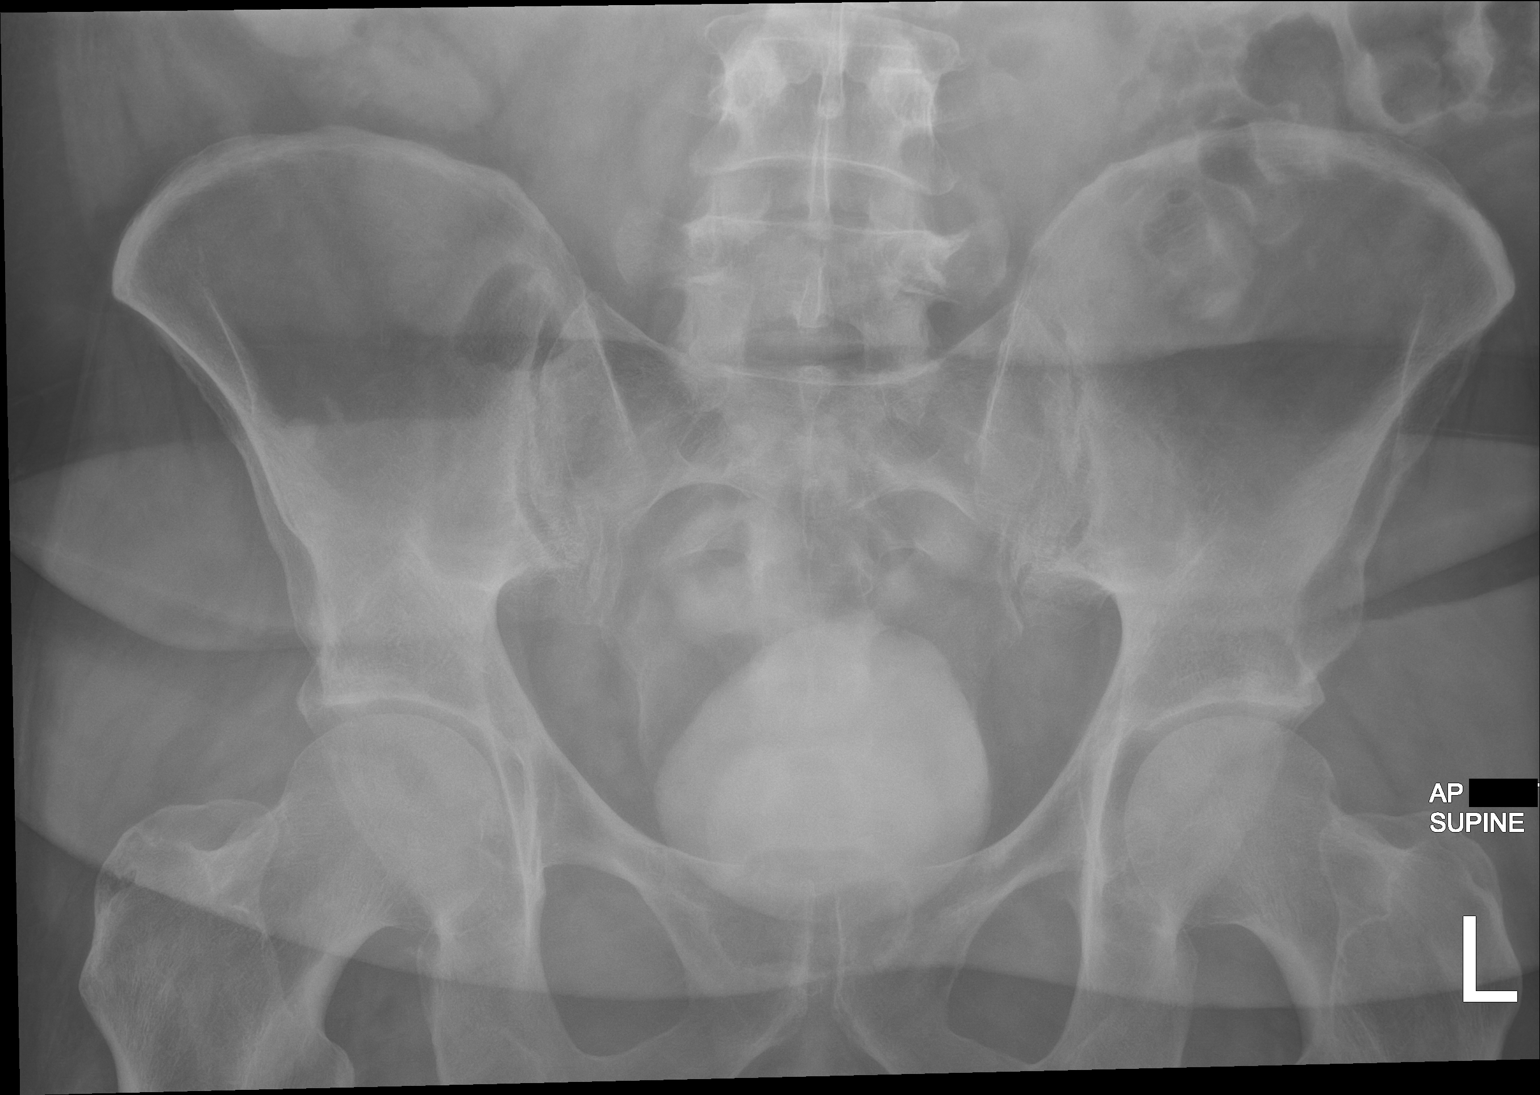

[2 of 2 positions shown; findings below may reference images not displayed]

FINDINGS: Enteric tube tip and side-port below the diaphragm in the stomach.
There is enteric contrast within the ascending colon. Improving
gaseous small bowel distension in the central abdomen. Small bowel
measures up to 2.9 cm. Cholecystectomy clips in the right upper
quadrant. Excreted IV contrast within the urinary bladder.
IMPRESSION: Enteric contrast within the ascending colon. Improving small bowel
distention since CT yesterday.

## 2023-06-03 IMAGING — CT CT ABD-PELV W/ CM
2 of 5 series · 16 of 46 positions shown, 18 images · IV contrast (OMNIPAQUE 350)
Comparison: 11/04/2014

CLINICAL DATA: Left upper quadrant pain, suspected diverticulitis

EXAM:
CT ABDOMEN AND PELVIS WITH CONTRAST
TECHNIQUE: Multidetector CT imaging of the abdomen and pelvis was performed
using the standard protocol following bolus administration of
intravenous contrast.
CONTRAST:  100mL OMNIPAQUE IOHEXOL 350 MG/ML SOLN

[Series 2: axial st · axial · 0.93mm/px · z∈[-280,+185]mm · 13 of 109 slices shown, 15 images]
[im 8/109  soft-tissue]
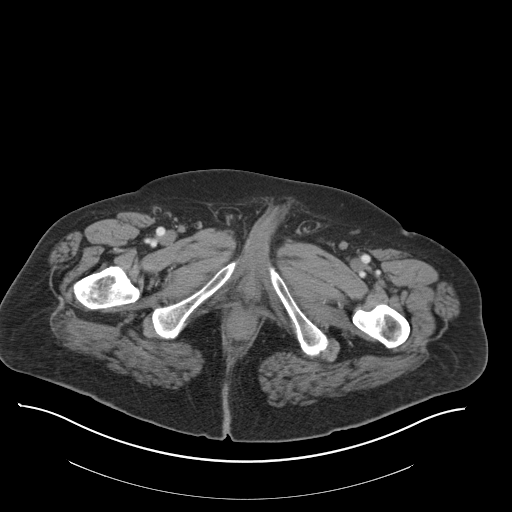
[im 8/109  bone]
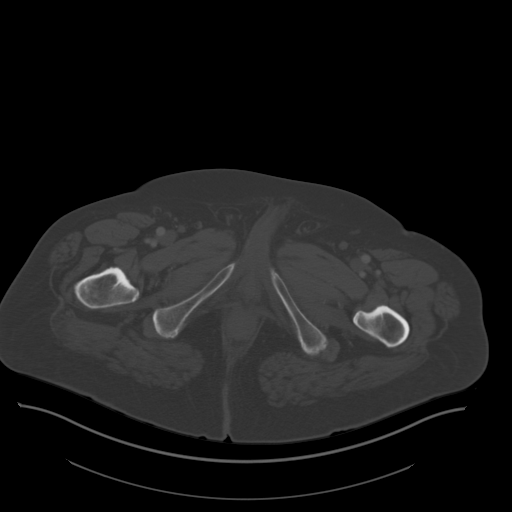
[im 16/109  soft-tissue]
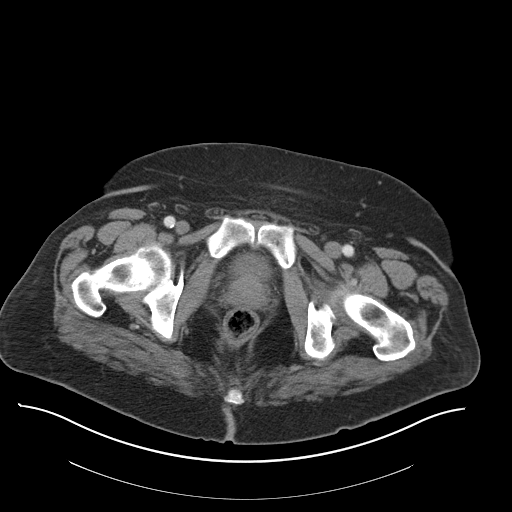
[im 24/109  soft-tissue]
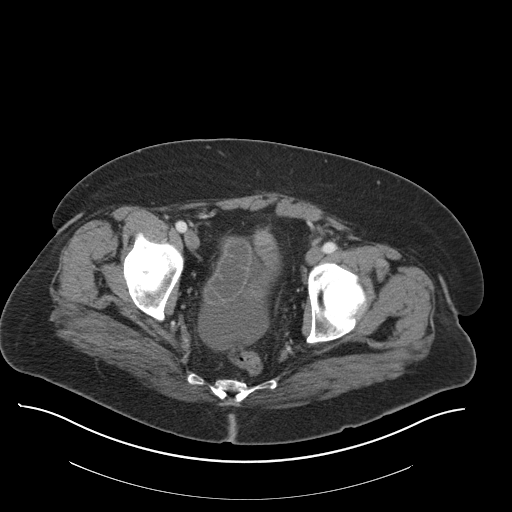
[im 31/109  soft-tissue]
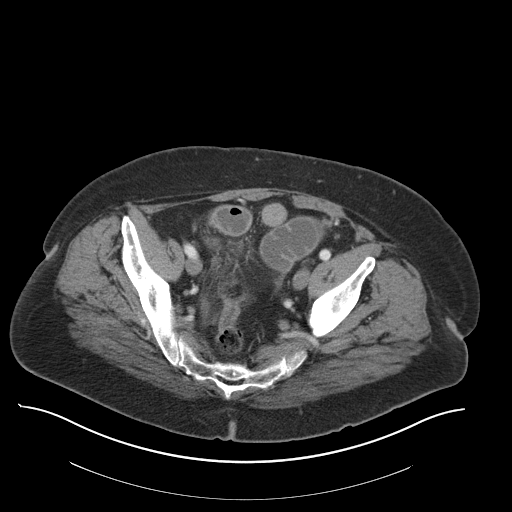
[im 39/109  soft-tissue]
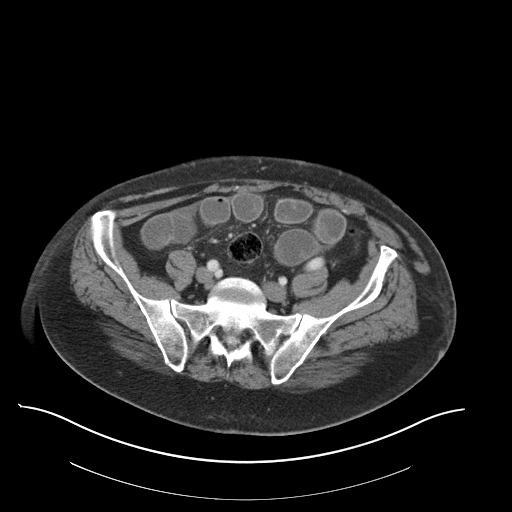
[im 47/109  soft-tissue]
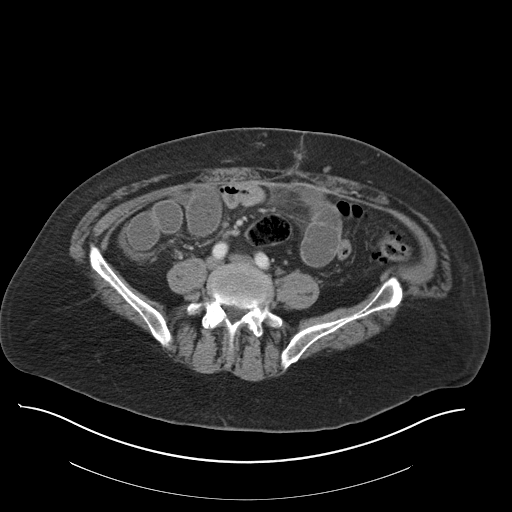
[im 55/109  soft-tissue]
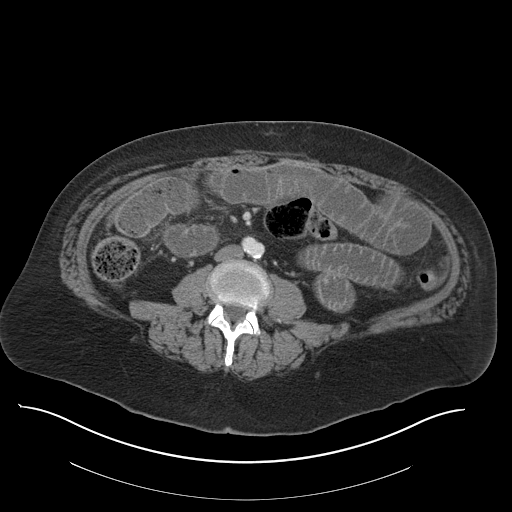
[im 62/109  soft-tissue]
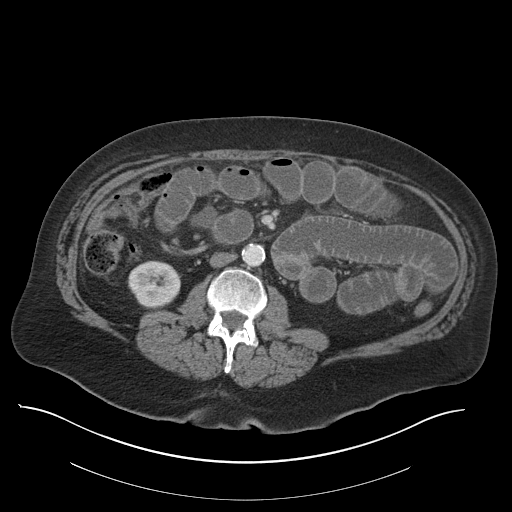
[im 70/109  soft-tissue]
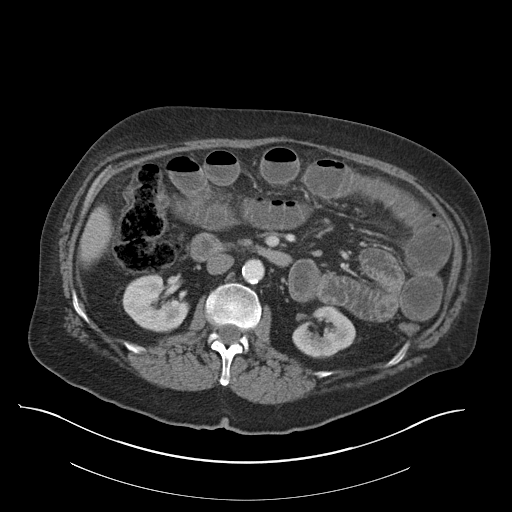
[im 70/109  bone]
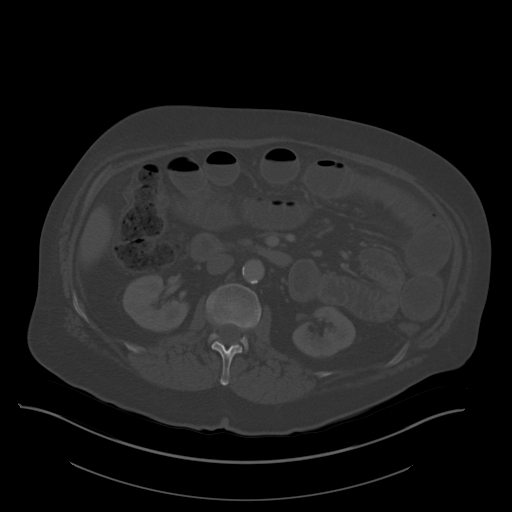
[im 78/109  soft-tissue]
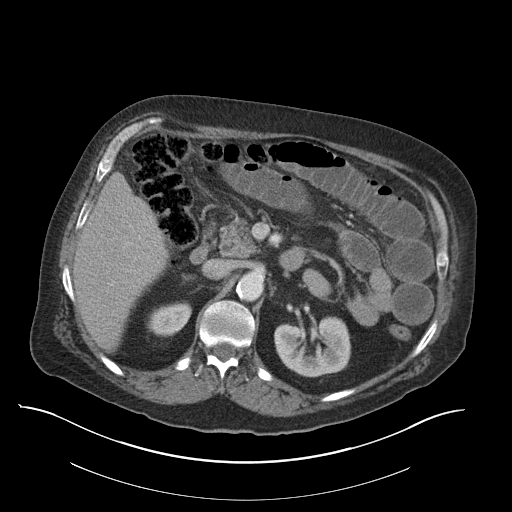
[im 85/109  soft-tissue]
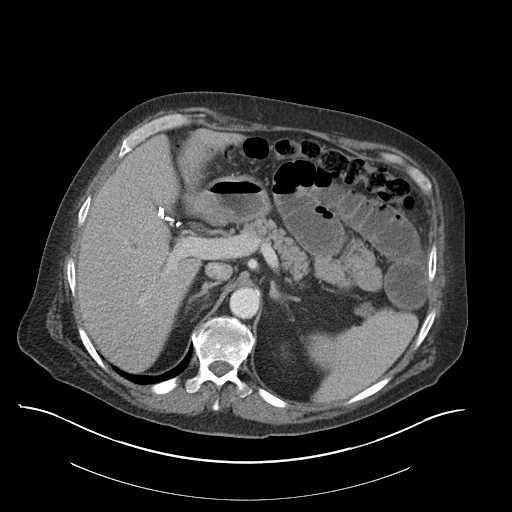
[im 93/109  soft-tissue]
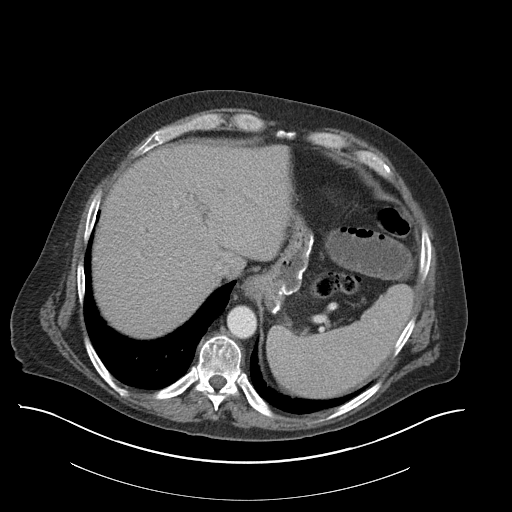
[im 101/109  soft-tissue]
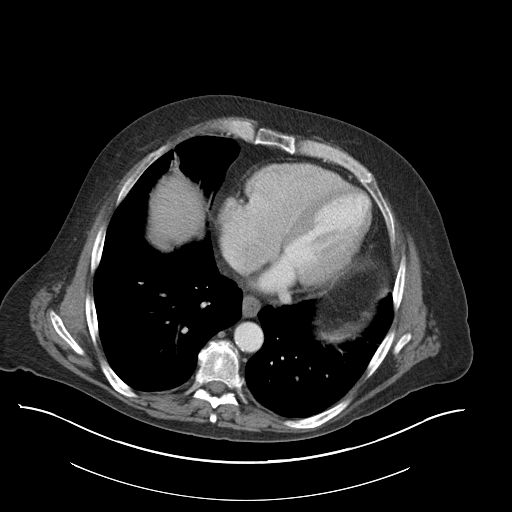

[Series 5: coronal st · coronal · 1.03mm/px · 3 of 158 slices shown]
[im 53/158  soft-tissue]
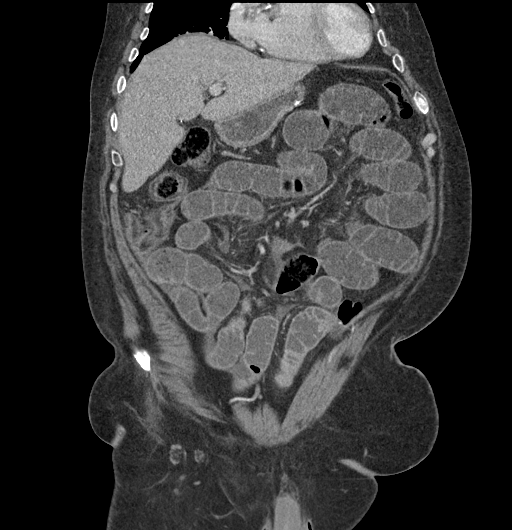
[im 70/158  soft-tissue]
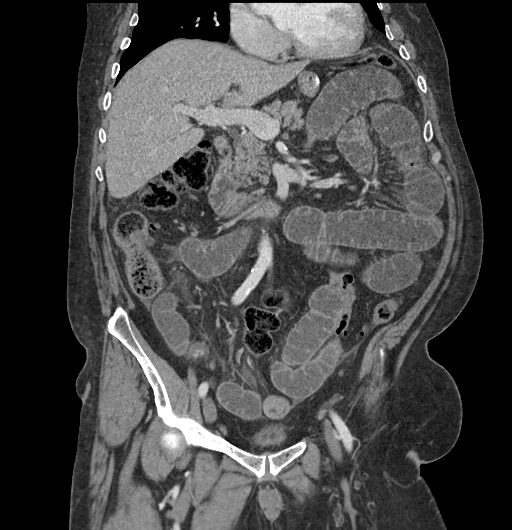
[im 88/158  soft-tissue]
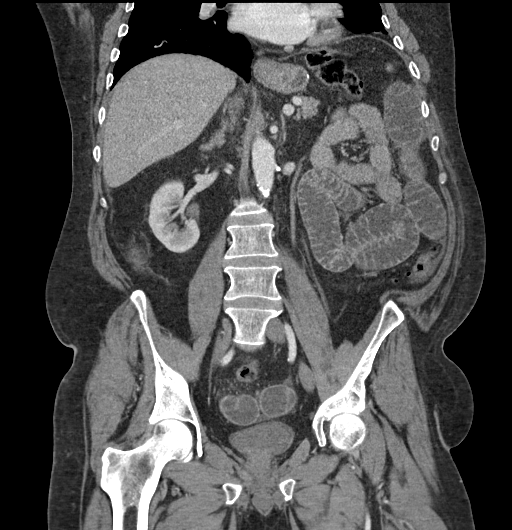

[16 of 46 positions shown; findings below may reference images not displayed]

FINDINGS: Lower chest: No acute abnormality.

Hepatobiliary: No focal liver abnormality is seen. Status post
cholecystectomy. No biliary dilatation.

Pancreas: Unremarkable. No pancreatic ductal dilatation or
surrounding inflammatory changes.

Spleen: Normal in size without focal abnormality.

Adrenals/Urinary Tract: Adrenal glands are unremarkable. Kidneys are
normal, without renal calculi, focal lesion, or hydronephrosis.
Bladder is unremarkable.

Stomach/Bowel: Staple line along the greater curvature of the
decompressed stomach. Duodenum is decompressed. There is dilatation
of mid and distal small bowel with fecalization of contents in the
distal ileum just proximal to the transition point to decompressed
terminal ileum and ileocecal valve. No discrete mass or abscess.
Normal appendix. The colon is nondistended. Scattered descending
diverticula without significant adjacent inflammatory/edematous
change.

Vascular/Lymphatic: Moderate scattered calcified aortic plaque
without aneurysm or stenosis. No abdominal or pelvic adenopathy.

Reproductive: Prostate is unremarkable.

Other: Small volume ascites, mostly in the pelvis.  No free air.

Musculoskeletal: Surgical clip and mesh from ventral hernia repair.
IMPRESSION: 1. Small bowel obstruction in the terminal ileum, without
perforation or abscess.
2. Small volume pelvic ascites.
3. Descending colon diverticulosis without CT evidence of
diverticulitis or abscess
4. Postop changes as above.

## 2023-07-08 ENCOUNTER — Encounter (HOSPITAL_COMMUNITY): Payer: Self-pay | Admitting: *Deleted
